# Patient Record
Sex: Male | Born: 1965 | Race: Black or African American | Hispanic: No | Marital: Married | Smoking: Light tobacco smoker
Health system: Southern US, Community
[De-identification: ages and names within clinical notes are randomized; demographics above are authoritative.]

## PROBLEM LIST (undated history)

## (undated) DIAGNOSIS — I10 Essential (primary) hypertension: Secondary | ICD-10-CM

## (undated) DIAGNOSIS — Z87442 Personal history of urinary calculi: Secondary | ICD-10-CM

## (undated) DIAGNOSIS — N200 Calculus of kidney: Secondary | ICD-10-CM

## (undated) HISTORY — PX: FOOT SURGERY: SHX648

---

## 2000-03-20 ENCOUNTER — Emergency Department (HOSPITAL_COMMUNITY): Admission: EM | Admit: 2000-03-20 | Discharge: 2000-03-20 | Payer: Self-pay | Admitting: Emergency Medicine

## 2000-03-20 ENCOUNTER — Encounter: Payer: Self-pay | Admitting: Emergency Medicine

## 2004-07-28 ENCOUNTER — Encounter: Admission: RE | Admit: 2004-07-28 | Discharge: 2004-07-28 | Payer: Self-pay | Admitting: Otolaryngology

## 2005-05-04 ENCOUNTER — Emergency Department (HOSPITAL_COMMUNITY): Admission: EM | Admit: 2005-05-04 | Discharge: 2005-05-04 | Payer: Self-pay | Admitting: Family Medicine

## 2006-06-28 ENCOUNTER — Ambulatory Visit: Payer: Self-pay | Admitting: Gastroenterology

## 2006-07-02 ENCOUNTER — Ambulatory Visit: Payer: Self-pay | Admitting: Gastroenterology

## 2006-07-18 ENCOUNTER — Ambulatory Visit: Payer: Self-pay | Admitting: Gastroenterology

## 2006-07-20 ENCOUNTER — Ambulatory Visit: Payer: Self-pay | Admitting: Cardiology

## 2011-02-19 ENCOUNTER — Emergency Department (HOSPITAL_COMMUNITY)
Admission: EM | Admit: 2011-02-19 | Discharge: 2011-02-19 | Disposition: A | Discharge status: Home / Self Care | Payer: 59 | Attending: Emergency Medicine | Admitting: Emergency Medicine

## 2011-02-19 ENCOUNTER — Emergency Department (HOSPITAL_COMMUNITY): Payer: 59

## 2011-02-19 DIAGNOSIS — R109 Unspecified abdominal pain: Secondary | ICD-10-CM | POA: Insufficient documentation

## 2011-02-19 DIAGNOSIS — R112 Nausea with vomiting, unspecified: Secondary | ICD-10-CM | POA: Insufficient documentation

## 2011-02-19 DIAGNOSIS — N1330 Unspecified hydronephrosis: Secondary | ICD-10-CM | POA: Insufficient documentation

## 2011-02-19 DIAGNOSIS — N201 Calculus of ureter: Secondary | ICD-10-CM | POA: Insufficient documentation

## 2011-02-19 LAB — COMPREHENSIVE METABOLIC PANEL
BUN: 12 mg/dL (ref 6–23)
CO2: 24 mEq/L (ref 19–32)
Chloride: 105 mEq/L (ref 96–112)
Creatinine, Ser: 1.51 mg/dL — ABNORMAL HIGH (ref 0.4–1.5)
GFR calc non Af Amer: 51 mL/min — ABNORMAL LOW (ref >60)
Total Bilirubin: 0.6 mg/dL (ref 0.3–1.2)

## 2011-02-19 LAB — DIFFERENTIAL
Basophils Absolute: 0 10*3/uL (ref 0.0–0.1)
Basophils Relative: 0 % (ref 0–1)
Eosinophils Absolute: 0.3 10*3/uL (ref 0.0–0.7)
Eosinophils Relative: 3 % (ref 0–5)
Lymphocytes Relative: 53 % — ABNORMAL HIGH (ref 12–46)
Lymphs Abs: 4.6 10*3/uL — ABNORMAL HIGH (ref 0.7–4.0)
Monocytes Absolute: 0.7 10*3/uL (ref 0.1–1.0)
Monocytes Relative: 8 % (ref 3–12)
Neutro Abs: 3.2 10*3/uL (ref 1.7–7.7)
Neutrophils Relative %: 36 % — ABNORMAL LOW (ref 43–77)

## 2011-02-19 LAB — CBC
HCT: 43.3 % (ref 39.0–52.0)
Hemoglobin: 14.8 g/dL (ref 13.0–17.0)
MCH: 26.9 pg (ref 26.0–34.0)
MCV: 78.7 fL (ref 78.0–100.0)
RBC: 5.5 MIL/uL (ref 4.22–5.81)

## 2011-02-19 LAB — URINALYSIS, ROUTINE W REFLEX MICROSCOPIC
Bilirubin Urine: NEGATIVE
Nitrite: NEGATIVE
Specific Gravity, Urine: 1.029 (ref 1.005–1.030)
Urobilinogen, UA: 1 mg/dL (ref 0.0–1.0)

## 2011-09-14 DIAGNOSIS — F419 Anxiety disorder, unspecified: Secondary | ICD-10-CM | POA: Insufficient documentation

## 2011-10-26 DIAGNOSIS — Z0000 Encounter for general adult medical examination without abnormal findings: Secondary | ICD-10-CM | POA: Insufficient documentation

## 2015-06-01 ENCOUNTER — Encounter (HOSPITAL_COMMUNITY): Payer: Self-pay | Admitting: Emergency Medicine

## 2015-06-01 ENCOUNTER — Emergency Department (HOSPITAL_COMMUNITY): Payer: Commercial Managed Care - HMO

## 2015-06-01 ENCOUNTER — Emergency Department (HOSPITAL_COMMUNITY)
Admission: EM | Admit: 2015-06-01 | Discharge: 2015-06-01 | Disposition: A | Discharge status: Home / Self Care | Payer: Commercial Managed Care - HMO | Attending: Emergency Medicine | Admitting: Emergency Medicine

## 2015-06-01 DIAGNOSIS — I10 Essential (primary) hypertension: Secondary | ICD-10-CM | POA: Diagnosis not present

## 2015-06-01 DIAGNOSIS — N200 Calculus of kidney: Secondary | ICD-10-CM | POA: Diagnosis not present

## 2015-06-01 DIAGNOSIS — R109 Unspecified abdominal pain: Secondary | ICD-10-CM | POA: Diagnosis present

## 2015-06-01 HISTORY — DX: Calculus of kidney: N20.0

## 2015-06-01 LAB — BASIC METABOLIC PANEL
ANION GAP: 11 (ref 5–15)
BUN: 9 mg/dL (ref 6–20)
CO2: 26 mmol/L (ref 22–32)
Calcium: 8.9 mg/dL (ref 8.9–10.3)
Chloride: 102 mmol/L (ref 101–111)
Creatinine, Ser: 1.56 mg/dL — ABNORMAL HIGH (ref 0.61–1.24)
GFR calc Af Amer: 59 mL/min — ABNORMAL LOW (ref >60)
GFR, EST NON AFRICAN AMERICAN: 51 mL/min — AB (ref >60)
Glucose, Bld: 130 mg/dL — ABNORMAL HIGH (ref 65–99)
Potassium: 3.3 mmol/L — ABNORMAL LOW (ref 3.5–5.1)
Sodium: 139 mmol/L (ref 135–145)

## 2015-06-01 LAB — URINALYSIS, ROUTINE W REFLEX MICROSCOPIC
Bilirubin Urine: NEGATIVE
Glucose, UA: NEGATIVE mg/dL
Ketones, ur: NEGATIVE mg/dL
LEUKOCYTES UA: NEGATIVE
NITRITE: NEGATIVE
PROTEIN: NEGATIVE mg/dL
Specific Gravity, Urine: 1.013 (ref 1.005–1.030)
Urobilinogen, UA: 0.2 mg/dL (ref 0.0–1.0)
pH: 5.5 (ref 5.0–8.0)

## 2015-06-01 LAB — CBC
HCT: 41.9 % (ref 39.0–52.0)
Hemoglobin: 14.3 g/dL (ref 13.0–17.0)
MCH: 26.9 pg (ref 26.0–34.0)
MCHC: 34.1 g/dL (ref 30.0–36.0)
MCV: 78.9 fL (ref 78.0–100.0)
Platelets: 197 10*3/uL (ref 150–400)
RBC: 5.31 MIL/uL (ref 4.22–5.81)
RDW: 13.2 % (ref 11.5–15.5)
WBC: 7.8 10*3/uL (ref 4.0–10.5)

## 2015-06-01 LAB — URINE MICROSCOPIC-ADD ON

## 2015-06-01 MED ORDER — MORPHINE SULFATE 4 MG/ML IJ SOLN
4.0000 mg | Freq: Once | INTRAMUSCULAR | Status: AC
  Administered 2015-06-01: 4 mg via INTRAVENOUS
  Filled 2015-06-01: qty 1

## 2015-06-01 MED ORDER — KETOROLAC TROMETHAMINE 30 MG/ML IJ SOLN
30.0000 mg | Freq: Once | INTRAMUSCULAR | Status: AC
  Administered 2015-06-01: 30 mg via INTRAVENOUS
  Filled 2015-06-01: qty 1

## 2015-06-01 MED ORDER — HYDROCODONE-ACETAMINOPHEN 5-325 MG PO TABS: 1.0000 | ORAL_TABLET | ORAL | Status: DC | PRN

## 2015-06-01 NOTE — Discharge Instructions (Signed)
Kidney Stones °Kidney stones (urolithiasis) are deposits that form inside your kidneys. The intense pain is caused by the stone moving through the urinary tract. When the stone moves, the ureter goes into spasm around the stone. The stone is usually passed in the urine.  °CAUSES  °· A disorder that makes certain neck glands produce too much parathyroid hormone (primary hyperparathyroidism). °· A buildup of uric acid crystals, similar to gout in your joints. °· Narrowing (stricture) of the ureter. °· A kidney obstruction present at birth (congenital obstruction). °· Previous surgery on the kidney or ureters. °· Numerous kidney infections. °SYMPTOMS  °· Feeling sick to your stomach (nauseous). °· Throwing up (vomiting). °· Blood in the urine (hematuria). °· Pain that usually spreads (radiates) to the groin. °· Frequency or urgency of urination. °DIAGNOSIS  °· Taking a history and physical exam. °· Blood or urine tests. °· CT scan. °· Occasionally, an examination of the inside of the urinary bladder (cystoscopy) is performed. °TREATMENT  °· Observation. °· Increasing your fluid intake. °· Extracorporeal shock wave lithotripsy--This is a noninvasive procedure that uses shock waves to break up kidney stones. °· Surgery may be needed if you have severe pain or persistent obstruction. There are various surgical procedures. Most of the procedures are performed with the use of small instruments. Only small incisions are needed to accommodate these instruments, so recovery time is minimized. °The size, location, and chemical composition are all important variables that will determine the proper choice of action for you. Talk to your health care provider to better understand your situation so that you will minimize the risk of injury to yourself and your kidney.  °HOME CARE INSTRUCTIONS  °· Drink enough water and fluids to keep your urine clear or pale yellow. This will help you to pass the stone or stone fragments. °· Strain  all urine through the provided strainer. Keep all particulate matter and stones for your health care provider to see. The stone causing the pain may be as small as a grain of salt. It is very important to use the strainer each and every time you pass your urine. The collection of your stone will allow your health care provider to analyze it and verify that a stone has actually passed. The stone analysis will often identify what you can do to reduce the incidence of recurrences. °· Only take over-the-counter or prescription medicines for pain, discomfort, or fever as directed by your health care provider. °· Make a follow-up appointment with your health care provider as directed. °· Get follow-up X-rays if required. The absence of pain does not always mean that the stone has passed. It may have only stopped moving. If the urine remains completely obstructed, it can cause loss of kidney function or even complete destruction of the kidney. It is your responsibility to make sure X-rays and follow-ups are completed. Ultrasounds of the kidney can show blockages and the status of the kidney. Ultrasounds are not associated with any radiation and can be performed easily in a matter of minutes. °SEEK MEDICAL CARE IF: °· You experience pain that is progressive and unresponsive to any pain medicine you have been prescribed. °SEEK IMMEDIATE MEDICAL CARE IF:  °· Pain cannot be controlled with the prescribed medicine. °· You have a fever or shaking chills. °· The severity or intensity of pain increases over 18 hours and is not relieved by pain medicine. °· You develop a new onset of abdominal pain. °· You feel faint or pass out. °·   You are unable to urinate. MAKE SURE YOU:   Understand these instructions.  Will watch your condition.  Will get help right away if you are not doing well or get worse. Document Released: 10/23/2005 Document Revised: 06/25/2013 Document Reviewed: 03/26/2013 [facility] Patient Information 2015  [place], [place]. This information is not intended to replace advice given to you by your health care provider. Make sure you discuss any questions you have with your health care provider. Hypertension Hypertension, commonly called high blood pressure, is when the force of blood pumping through your arteries is too strong. Your arteries are the blood vessels that carry blood from your heart throughout your body. A blood pressure reading consists of a higher number over a lower number, such as 110/72. The higher number (systolic) is the pressure inside your arteries when your heart pumps. The lower number (diastolic) is the pressure inside your arteries when your heart relaxes. Ideally you want your blood pressure below 120/80. Hypertension forces your heart to work harder to pump blood. Your arteries may become narrow or stiff. Having hypertension puts you at risk for heart disease, stroke, and other problems.  RISK FACTORS Some risk factors for high blood pressure are controllable. Others are not.  Risk factors you cannot control include:   Race. You may be at higher risk if you are African American.  Age. Risk increases with age.  Gender. Men are at higher risk than women before age 33 years. After age 11, women are at higher risk than men. Risk factors you can control include:  Not getting enough exercise or physical activity.  Being overweight.  Getting too much fat, sugar, calories, or salt in your diet.  Drinking too much alcohol. SIGNS AND SYMPTOMS Hypertension does not usually cause signs or symptoms. Extremely high blood pressure (hypertensive crisis) may cause headache, anxiety, shortness of breath, and nosebleed. DIAGNOSIS  To check if you have hypertension, your health care provider will measure your blood pressure while you are seated, with your arm held at the level of your heart. It should be measured at least twice using the same arm. Certain conditions can cause a difference in  blood pressure between your right and left arms. A blood pressure reading that is higher than normal on one occasion does not mean that you need treatment. If one blood pressure reading is high, ask your health care provider about having it checked again. TREATMENT  Treating high blood pressure includes making lifestyle changes and possibly taking medicine. Living a healthy lifestyle can help lower high blood pressure. You may need to change some of your habits. Lifestyle changes may include:  Following the DASH diet. This diet is high in fruits, vegetables, and whole grains. It is low in salt, red meat, and added sugars.  Getting at least 2 hours of brisk physical activity every week.  Losing weight if necessary.  Not smoking.  Limiting alcoholic beverages.  Learning ways to reduce stress. If lifestyle changes are not enough to get your blood pressure under control, your health care provider may prescribe medicine. You may need to take more than one. Work closely with your health care provider to understand the risks and benefits. HOME CARE INSTRUCTIONS  Have your blood pressure rechecked as directed by your health care provider.   Take medicines only as directed by your health care provider. Follow the directions carefully. Blood pressure medicines must be taken as prescribed. The medicine does not work as well when you skip doses. Skipping doses  also puts you at risk for problems.   Do not smoke.   Monitor your blood pressure at home as directed by your health care provider. SEEK MEDICAL CARE IF:   You think you are having a reaction to medicines taken.  You have recurrent headaches or feel dizzy.  You have swelling in your ankles.  You have trouble with your vision. SEEK IMMEDIATE MEDICAL CARE IF:  You develop a severe headache or confusion.  You have unusual weakness, numbness, or feel faint.  You have severe chest or abdominal pain.  You vomit repeatedly.  You  have trouble breathing. MAKE SURE YOU:   Understand these instructions.  Will watch your condition.  Will get help right away if you are not doing well or get worse. Document Released: 10/23/2005 Document Revised: 03/09/2014 Document Reviewed: 08/15/2013 [facility] Patient Information 2015 [place], [place]. This information is not intended to replace advice given to you by your health care provider. Make sure you discuss any questions you have with your health care provider.  Emergency Department Resource Guide 1) Find a Doctor and Pay Out of Pocket Although you won't have to find out who is covered by your insurance plan, it is a good idea to ask around and get recommendations. You will then need to call the office and see if the doctor you have chosen will accept you as a new patient and what types of options they offer for patients who are self-pay. Some doctors offer discounts or will set up payment plans for their patients who do not have insurance, but you will need to ask so you aren't surprised when you get to your appointment.  2) Contact Your Local Health Department Not all health departments have doctors that can see patients for sick visits, but many do, so it is worth a call to see if yours does. If you don't know where your local health department is, you can check in your phone book. The CDC also has a tool to help you locate your state's health department, and many state websites also have listings of all of their local health departments.  3) Find a [facility] If your illness is not likely to be very severe or complicated, you may want to try a walk in clinic. These are popping up all over the country in pharmacies, drugstores, and shopping centers. They're usually staffed by nurse practitioners or physician assistants that have been trained to treat common illnesses and complaints. They're usually fairly quick and inexpensive. However, if you have serious medical issues or  chronic medical problems, these are probably not your best option.  No Primary Care Doctor: - Call Health Connect at  [phone] - they can help you locate a primary care doctor that  accepts your insurance, provides certain services, etc. - Physician Referral Service- [phone]  Chronic Pain Problems: Organization         Address  Phone   Notes  [facility]  [phone] Patients need to be referred by their primary care doctor.   Medication Assistance: Organization         Address  Phone   Notes  [facility] Medication [facility] [address] [place], [place] [address] [phone] --Must be a resident of [facility] -- Must have NO insurance coverage whatsoever (no Medicaid/ Medicare, etc.) -- The pt. MUST have a primary care doctor that directs their care regularly and follows them in the community   MedAssist  (  [phone]   [employer]  [phone]    Agencies that provide inexpensive medical care: Organization         Address  Phone   Notes  [place]  [phone]   [facility]    [phone]   [facility] [place], [place] [address] [phone]   [place] [address], [place] [phone]   Planned Parenthood    [phone]   [facility]    [phone]   [place] and [place] [address], [place] Phone:  [phone], Fax:  [phone] Hours of Operation:  9 am - 6 pm, M-F.  Also accepts Medicaid/Medicare and self-pay.  [facility] [place], [address], [place] Phone: [phone], Fax: [phone]. Hours of Operation:  8:30 am - 5:30 pm, M-F.  Also accepts Medicaid and self-pay.  [facility] [place] [address] [place] Phone: [phone]   [place], [place], [place]  [phone], Ext. [phone] Mondays & Thursdays: 7-9 AM.  First 15 patients are seen on a first come, first serve basis.    [place] Providers:  Organization         Address  Phone   Notes  [facility] [address],  [phone] Also accepts self-pay patients.  [facility] [address], [place]  [phone]   [place], [address], [place] [phone]   [facility] [address] [place] [phone]   Lucianne Lei [address], [place]   [phone] Only accepts [place] Access [place] patients after they have their name applied to their card.   Self-Pay (no insurance) in [facility]:  Organization         Address  Phone   Notes  Sickle Cell Patients, [facility] [place] [phone]   [facility] [place] [phone]   [facility] [place]  [place], [place], [place] [phone]   [facility]/Dr. Osei-Bonsu  [address], [place] or [address], [place] [phone] Phone number for both [place] and [place] locations is the same.  [facility] [address] [place] [phone]   [place] [address] [place] or [address] [phone]   [facility] [place] [address] [place] [phone], phone; [phone], fax Sees patients 1st and 3rd Saturday of every month.  Must not qualify for public or private insurance (i.e. Medicaid, Medicare, [place] Health Choice, Veterans' Benefits)  Household income should be no more than 200% of the poverty level The clinic cannot treat you if you are pregnant or think you are pregnant  Sexually transmitted  diseases are not treated at the clinic.    Dental Care: Organization         Address  Phone  Notes  [facility] of [facility] [phone] Accepts children up to age 14 who are enrolled in [place] or [place]; pregnant women  with a Medicaid card; and children who have applied for Medicaid or [place] Health Choice, but were declined, whose parents can pay a reduced fee at time of service.  [facility] Department of [facility]  [address], [place] [phone] Accepts children up to age 25 who are enrolled in [place] or [place]; pregnant women with a Medicaid card; and children who have applied for Medicaid or [place] Health Choice, but were declined, whose parents can pay a reduced fee at time of service.  [place] [facility] Adult Dental Access PROGRAM  [place] [phone] Patients are seen by appointment only. Walk-ins are not accepted. [place] will see patients 67 years of age and older. Monday - Tuesday (8am-5pm) Most Wednesdays (8:30-5pm) $30 per visit, cash only  [facility] Adult Dental Access PROGRAM  [address], [facility] [phone] Patients are seen by appointment only. Walk-ins are not accepted. [place] will see patients 76 years of age and older. One Wednesday Evening (Monthly: Volunteer Based).  $30 per visit, cash only  [place]  [phone] for adults; Children under age 60, call Graduate Pediatric Dentistry at [phone]. Children aged 78-14, please call [phone] to request a pediatric application.  Dental services are provided in all areas of dental care including fillings, crowns and bridges, complete and partial dentures, implants, gum treatment, root canals, and extractions. Preventive care is also provided. Treatment is provided to both adults and children. Patients are selected via a  lottery and there is often a waiting list.   [facility] [address] [place]  [phone] [email or web address]   [facility] [address] [place], [place] [phone], Ext. [phone] Second and Fourth Thursday of each month, opens at 6:30 AM; Clinic ends at 9 AM.  Patients are seen on a first-come first-served basis, and a limited number are seen during each clinic.   [facility]  [address] [place] Danker [place], [place] [phone]   Eligibility Requirements You must have lived in [place], [place], or [place] counties for at least the last three months.   You cannot be eligible for state or federal sponsored [employer], including [employer], [place], or [employer].   You generally cannot be eligible for healthcare insurance through your employer.    How to apply: Eligibility screenings are held every Tuesday and Wednesday afternoon from 1:00 pm until 4:00 pm. You do not need an appointment for the interview!  [facility] [address], [place], [place]   [place]  [place] Department  [place]  [phone]    Behavioral Health Resources in the Community: Intensive Outpatient Programs Organization         Address  Phone  Notes  [place]erman Clarence. [address], [place], [place] [phone]   [facility] Outpatient [address] [place], [place]   ADS: Alcohol & Drug S[facility] [address] [place], [place]   [place] [address]  [place], [place] or [phone]   Substance Abuse Resources Organization         Address  Phone  Notes  Alcohol and Drug Services  [phone]   [facility]  [phone]   [facility]   [facility]  [phone]   Residential &  Outpatient Substance Abuse Program  [phone]   Psychological Services Organization         Address  Phone  Notes  [facility]  [place]  [phone]   [place] [address], [place] or [phone]    Mobile Crisis Teams Organization         Address  Phone  Notes  [facility], Mobile Crisis Care Unit  [phone]   [facility]  [address] [place], [place]   [place] [address], [place] [phone]    Self-Help/Support Groups Organization         Address  Phone             Notes  [place]. of [place] - variety of support groups  [place] Call for more information  Narcotics Anonymous (NA), [facility]aring [facility] [address], [employer] [place]  2 meetings at this location   Special educational needs teacher         Address  Phone  Notes  [facility] [place],    [place]  [phone]   [facility]  [address], [place] [address]17711, [place], [place]   [place], [place] [phone] Admissions: 8am-3pm M-F  Incentives Substance [place] [address]    [place], [place] [phone]   [facility] [address] [place], [place], [place]   [facility] [address],  [place], [place]   [facility]ight Programs - Intensive Outpatient [address] Kristeen [address], [place], [place]   [facility] ([place].) [address].,  [place], [place] [phone] or [phone]   [facility]idential Treatment Services (RTS) [address] [place], [place] Accepts Medicaid  [facility] [place] [address]  [place] [phone] Substance Abuse/Addiction Treatment   [facility] Organization          Address  Phone  Notes  [facility]  [phone]   Domenic Schwab, PhD [address] Arlis [place], [place]   [phone] or [phone]   [place] Montura Foley [place], [place] [phone]   [facility] [address] [place], [place] [phone] Insurance/Medicaid/sponsorship through [facility] and Families [address], [address] [place]                                    [place], [place] [phone] [place] [address], [place] [phone]    Dr. Adele Schilder  [phone]   [facility] [place] Dept. 1) [address] [place] 2) [place] 3)  [place] [place] [address], [place] [phone]  [phone]   [place] [phone] or [phone] (After Hours)

## 2015-06-01 NOTE — ED Provider Notes (Signed)
CSN: [id number]     Arrival date & time 06/01/15  0425 History   First MD Initiated Contact with Patient 06/01/15 [phone]     Chief Complaint  Patient presents with  . Flank Pain     (Consider location/radiation/quality/duration/timing/severity/associated sxs/prior Treatment) HPI Sudden onset of right flank pain last night. Awoke from sleep at 3 in the morning. Feels like prior kidney stone. Some associated nausea no vomiting. Radius to groin with no associated testicular pain. Urinary urgency after onset of symptoms. No fever no recent illness. Patient has history kidney stones this is very similar to prior. Past Medical History  Diagnosis Date  . Kidney stone    History reviewed. No pertinent past surgical history. No family history on file. History  Substance Use Topics  . Smoking status: Never Smoker   . Smokeless tobacco: Not on file  . Alcohol Use: No    Review of Systems  10 Systems reviewed and are negative for acute change except as noted in the HPI.   Allergies  Review of patient's allergies indicates no known allergies.  Home Medications   Prior to Admission medications   Medication Sig Start Date End Date Taking? Authorizing Provider  HYDROcodone-acetaminophen (NORCO/VICODIN) 5-325 MG per tablet Take 1-2 tablets by mouth every 4 (four) hours as needed for moderate pain or severe pain. 06/01/15   Charlesetta Shanks, MD   BP 160/88 mmHg  Pulse 58  Temp(Src) 97.5 F (36.4 C) (Oral)  Resp 18  Ht 5\' 10"  (1.778 m)  Wt 260 lb (117.935 kg)  BMI 37.31 kg/m2  SpO2 98% Physical Exam  Constitutional: He is oriented to person, place, and time. He appears well-developed and well-nourished.  HENT:  Head: Normocephalic and atraumatic.  Eyes: EOM are normal. Pupils are equal, round, and reactive to light.  Neck: Neck supple.  Cardiovascular: Normal rate, regular rhythm, normal heart sounds and intact distal pulses.   Pulmonary/Chest: Effort normal and breath sounds normal.   Abdominal: Soft. Bowel sounds are normal. He exhibits no distension. There is no tenderness.  No flank pain to percussion.  Musculoskeletal: Normal range of motion. He exhibits no edema.  Neurological: He is alert and oriented to person, place, and time. He has normal strength. Coordination normal. GCS eye subscore is 4. GCS verbal subscore is 5. GCS motor subscore is 6.  Skin: Skin is warm, dry and intact.  Psychiatric: He has a normal mood and affect.    ED Course  Procedures (including critical care time) Labs Review Labs Reviewed  URINALYSIS, ROUTINE W REFLEX MICROSCOPIC (NOT AT [facility]) - Abnormal; Notable for the following:    Hgb urine dipstick MODERATE (*)    All other components within normal limits  BASIC METABOLIC PANEL - Abnormal; Notable for the following:    Potassium 3.3 (*)    Glucose, Bld 130 (*)    Creatinine, Ser 1.56 (*)    GFR calc non Af Amer 51 (*)    GFR calc Af Amer 59 (*)    All other components within normal limits  CBC  URINE MICROSCOPIC-ADD ON    Imaging Review Ct Renal Stone Study  06/01/2015   CLINICAL DATA:  49 year old male with right flank and lateral abdominal pain. Microscopic hematuria. History kidney stones. No prior surgery. Subsequent encounter.  EXAM: CT ABDOMEN AND PELVIS WITHOUT CONTRAST  TECHNIQUE: Multidetector CT imaging of the abdomen and pelvis was performed following the standard protocol without IV contrast.  COMPARISON:  10/11/2012 CT.  FINDINGS: 2 mm  mid to upper pole right renal nonobstructing calculus. Mild haziness surrounds the proximal right ureter without calcified obstructing stone identified or evidence of hydronephrosis. This may indicate changes of recently passed stone.  2 cm right lower pole renal cyst.  Scattered diverticula most notable descending colon and sigmoid colon without extra luminal bowel inflammatory process, free fluid or free air. Specifically, no inflammation surrounds the appendix or terminal ileum. Stool like  appearance of the terminal ileum may indicate changes of malabsorption.  Supraumbilical fat and vessel containing abdominal wall hernia similar to prior exam.  Small hiatal hernia. Lung bases clear. Heart size within normal limits.  Taking into account limitation by non contrast imaging, no worrisome hepatic, splenic, pancreatic, left renal or adrenal abnormality. No calcified gallstones.  No abdominal aortic aneurysm.  No adenopathy. Sub cm slightly rounded lymph node posterior to pancreas.  Minimally lobulated prostate gland. Noncontrast filled underdistended views the urinary bladder without obvious abnormality.  No bony destructive lesion. Probable bone island left intertrochanteric region without change. Small bone island left ilium stable. Bony overgrowth sacroiliac joint.  IMPRESSION: 2 mm mid to upper pole right renal nonobstructing calculus. Mild haziness surrounds the proximal right ureter without calcified obstructing stone identified or evidence of hydronephrosis. This may indicate changes of recently passed stone.  2 cm right lower pole renal cyst.  Scattered diverticula most notable descending colon and sigmoid colon without extra luminal bowel inflammatory process, free fluid or free air. Specifically, no inflammation surrounds the appendix or terminal ileum. Stool like appearance of the terminal ileum may indicate changes of malabsorption.  Supraumbilical fat and vessel containing abdominal wall hernia similar to prior exam.  Small hiatal hernia.  Minimally lobulated prostate gland.  Under distended views of the urinary bladder without gross abnormality.   Electronically Signed   By: Genia Del M.D.   On: 06/01/2015 08:14     EKG Interpretation None      MDM   Final diagnoses:  Kidney stone  Chronic hypertension    Patient has history kidney stones. He has a small kidney stone today. Symptoms are well controlled with pain medication. Patient has had borderline hypertension which has  been untreated. He does not have any and organ damage symptoms today. He has had a borderline increased creatinine in association with this. Patient was counseled on hypertension and the necessity of treatment with risk of kidney failure, coronary artery disease and other complications. He is in good condition today alert appropriate and pain with a kidney stone is not intractable.    Charlesetta Shanks, MD 06/01/15 [phone]

## 2015-06-01 NOTE — ED Notes (Signed)
Patient here with complaint of right flank pain. States history of the same about 2 years ago. Pain much worse that time. Started this AM "at 0336 exactly".

## 2018-07-20 ENCOUNTER — Ambulatory Visit: Payer: 59 | Admitting: Podiatry

## 2018-07-24 ENCOUNTER — Other Ambulatory Visit: Payer: Self-pay

## 2018-07-27 ENCOUNTER — Ambulatory Visit (INDEPENDENT_AMBULATORY_CARE_PROVIDER_SITE_OTHER): Payer: 59

## 2018-07-27 ENCOUNTER — Encounter: Payer: Self-pay | Admitting: Sports Medicine

## 2018-07-27 ENCOUNTER — Other Ambulatory Visit: Payer: Self-pay

## 2018-07-27 ENCOUNTER — Ambulatory Visit (INDEPENDENT_AMBULATORY_CARE_PROVIDER_SITE_OTHER): Payer: 59 | Admitting: Sports Medicine

## 2018-07-27 ENCOUNTER — Other Ambulatory Visit: Payer: Self-pay | Admitting: Sports Medicine

## 2018-07-27 DIAGNOSIS — M79672 Pain in left foot: Secondary | ICD-10-CM | POA: Diagnosis not present

## 2018-07-27 DIAGNOSIS — M67472 Ganglion, left ankle and foot: Secondary | ICD-10-CM

## 2018-07-27 NOTE — Progress Notes (Signed)
Subjective: Jeremy Gray is a 52 y.o. male patient who presents to office for evaluation of left foot pain. Patient complains of progressive pain especially over the last year that has slowly gotten worse over the small growth or not at his arch area on the left foot states that this area is very tender worse when he is putting pressure. Patient has not tried any treatment for this. Patient denies any other pedal complaints. Denies injury/trip/fall/sprain/any causative factors.   Review of Systems  Musculoskeletal: Positive for myalgias.  All other systems reviewed and are negative.    Patient Active Problem List   Diagnosis Date Noted  . Encounter for general adult medical examination without abnormal findings 10/26/2011  . Anxiety 09/14/2011    No current outpatient medications on file prior to visit.   No current facility-administered medications on file prior to visit.     No Known Allergies  Objective:  General: Alert and oriented x3 in no acute distress  Dermatology: Soft tissue mass at medial arch of left foot with mild callus overlying it with no signs of infection, No open lesions bilateral lower extremities, no webspace macerations, no ecchymosis bilateral, all nails x 10 are well manicured.  Vascular: Dorsalis Pedis and Posterior Tibial pedal pulses palpable, Capillary Fill Time 3 seconds,(+) pedal hair growth bilateral, no edema bilateral lower extremities, Temperature gradient within normal limits.  Neurology: Johney Maine sensation intact via light touch bilateral, (- )Tinels sign bilateral.   Musculoskeletal: Mild tenderness with palpation at at left medial arch on left, No pain with calf compression bilateral. There is decreased ankle rom with knee extending  vs flexed resembling gastroc equnius bilateral,+ pes planus foot type bilateral, Strength within normal limits in all groups bilateral.   Gait: Antalgic gait  Xrays  Left Foot   Impression:Normal osseous  mineralization, midtarsal breech supportive of pes planus, inferior heel spur, no other acute findings.  Assessment and Plan: Problem List Items Addressed This Visit    None    Visit Diagnoses    Left foot pain    -  Primary   Relevant Orders   DG Foot Complete Right   Ganglion cyst of left foot       Arch pain of left foot          -Complete examination performed -Xrays reviewed -Discussed treatement options for pain and possible cyst vs soft tissue mass at medial arch on Left -Rx Ultrasound to evaluate mass at left arch -Recommend gentle stretching, ice, motrin/tylenol as needed and offloading padding as provided -Patient to return to office after Ultrasound or sooner if condition worsens.  Landis Martins, DPM

## 2018-07-29 ENCOUNTER — Telehealth: Payer: Self-pay | Admitting: *Deleted

## 2018-07-29 DIAGNOSIS — M67472 Ganglion, left ankle and foot: Secondary | ICD-10-CM

## 2018-07-29 DIAGNOSIS — M79672 Pain in left foot: Secondary | ICD-10-CM

## 2018-07-29 NOTE — Telephone Encounter (Signed)
Faxed orders to [facility] Main Scheduling. 

## 2018-07-29 NOTE — Telephone Encounter (Signed)
-----   Message from Landis Martins, [place] sent at 07/27/2018  8:50 AM EDT ----- Regarding: US Soft tissue mass at medial arch that is painful left foot

## 2018-08-05 ENCOUNTER — Ambulatory Visit (HOSPITAL_COMMUNITY)
Admission: RE | Admit: 2018-08-05 | Discharge: 2018-08-05 | Disposition: A | Discharge status: Home / Self Care | Payer: 59 | Source: Ambulatory Visit | Attending: Sports Medicine | Admitting: Sports Medicine

## 2018-08-05 DIAGNOSIS — M67472 Ganglion, left ankle and foot: Secondary | ICD-10-CM | POA: Insufficient documentation

## 2018-08-05 DIAGNOSIS — M79672 Pain in left foot: Secondary | ICD-10-CM | POA: Diagnosis present

## 2018-08-07 ENCOUNTER — Telehealth: Payer: Self-pay | Admitting: *Deleted

## 2018-08-07 NOTE — Telephone Encounter (Signed)
-----   Message from Landis Martins, [place] sent at 08/06/2018 12:52 PM EDT ----- [place] results are in. It is a begnin lesion/mass at the medial aspect of his Left foot. Patient should make an appointment within 2 weeks for [place] to go over results in person and discuss plan -Dr. Cannon Kettle

## 2018-08-07 NOTE — Telephone Encounter (Signed)
Left message on mobile for Mr. Bokhari to call me.

## 2018-08-07 NOTE — Telephone Encounter (Signed)
I called pt and informed of Dr. Leeanne Rio review of results and orders. Transferred pt to scheduler.

## 2018-08-20 ENCOUNTER — Ambulatory Visit: Payer: 59 | Admitting: Sports Medicine

## 2018-09-03 ENCOUNTER — Encounter

## 2018-09-03 ENCOUNTER — Other Ambulatory Visit: Payer: Self-pay

## 2018-09-03 ENCOUNTER — Ambulatory Visit: Payer: 59 | Admitting: Sports Medicine

## 2018-09-03 ENCOUNTER — Encounter: Payer: Self-pay | Admitting: Sports Medicine

## 2018-09-03 DIAGNOSIS — M79672 Pain in left foot: Secondary | ICD-10-CM

## 2018-09-03 DIAGNOSIS — M60272 Foreign body granuloma of soft tissue, not elsewhere classified, left ankle and foot: Secondary | ICD-10-CM

## 2018-09-03 NOTE — Progress Notes (Signed)
Subjective: Jeremy Gray is a 52 y.o. male patient who returns to office for follow-up evaluation of left foot and arch pain.  Patient states that he got his ultrasound done and reports that he still has pain and the knot that is present in his left arch area states that rest protection icing and taking anti-inflammatories occasionally seems to help however still concerned that the mass is still there and wants to discuss his ultrasound results and to discuss plan of care.  Patient denies changes with medicines or health since last visit and denies nausea vomiting fever chills or any other constitutional symptoms at this time.   Patient Active Problem List   Diagnosis Date Noted  . Encounter for general adult medical examination without abnormal findings 10/26/2011  . Anxiety 09/14/2011    Current Outpatient Medications on File Prior to Visit  Medication Sig Dispense Refill  . amLODipine (NORVASC) 5 MG tablet Take 5 mg by mouth daily.  1  . losartan (COZAAR) 50 MG tablet Take 50 mg by mouth daily.  1   No current facility-administered medications on file prior to visit.     No Known Allergies  Objective:  General: Alert and oriented x3 in no acute distress  Dermatology: Soft tissue mass at medial arch of left foot with mild callus overlying it with no signs of infection and no acute changes since last exam, No open lesions bilateral lower extremities, no webspace macerations, no ecchymosis bilateral, all nails x 10 are well manicured.  Vascular: Dorsalis Pedis and Posterior Tibial pedal pulses palpable, Capillary Fill Time 3 seconds,(+) pedal hair growth bilateral, no edema bilateral lower extremities, Temperature gradient within normal limits.  Neurology: Gross sensation intact via light touch bilateral, (- )Tinels sign bilateral.   Musculoskeletal: Mild tenderness with palpation at at left medial arch on left at soft tissue mass, No pain with calf compression bilateral. There is  decreased ankle rom with knee extending  vs flexed resembling gastroc equnius bilateral,+ pes planus foot type bilateral, Strength within normal limits in all groups bilateral.   Assessment and Plan: Problem List Items Addressed This Visit    None    Visit Diagnoses    Foreign body granuloma of soft tissue of left foot    -  Primary   Arch pain of left foot       Left foot pain          -Complete examination performed -Ultrasound results reviewed suggestive of nonspecific Solik lesion in the area of concern possible foreign body granuloma but difficult to characterize -Discussed treatement options for pain and soft tissue mass at medial arch on Left -Patient opt for surgical management. Consent obtained for excision of soft tissue mass left arch.  Pre and Post op course explained. Risks, benefits, alternatives explained. No guarantees given or implied. Surgical booking slip submitted and provided patient with Surgical packet and info for [facility].  -To dispense cam boot at surgical center and patient will call when ready for his date of surgery -Recommend gentle stretching, ice, motrin/tylenol as needed and offloading padding as provided previous until time for surgery -Patient to return to office after surgery or sooner if condition worsens.  Landis Martins, DPM

## 2018-09-03 NOTE — Patient Instructions (Signed)
Pre-Operative Instructions  Congratulations, you have decided to take an important step towards improving your quality of life.  You can be assured that the doctors and staff at [facility] will be with you every step of the way.  Here are some important things you should know:  1. Plan to be at the surgery center/hospital at least 1 (one) hour prior to your scheduled time, unless otherwise directed by the surgical center/hospital staff.  You must have a responsible adult accompany you, remain during the surgery and drive you home.  Make sure you have directions to the surgical center/hospital to ensure you arrive on time. 2. If you are having surgery at [facility] or [facility] hospitals, you will need a copy of your medical history and physical form from your family physician within one month prior to the date of surgery. We will give you a form for your primary physician to complete.  3. We make every effort to accommodate the date you request for surgery.  However, there are times where surgery dates or times have to be moved.  We will contact you as soon as possible if a change in schedule is required.   4. No aspirin/ibuprofen for one week before surgery.  If you are on aspirin, any non-steroidal anti-inflammatory medications (Mobic, Aleve, Ibuprofen) should not be taken seven (7) days prior to your surgery.  You make take Tylenol for pain prior to surgery.  5. Medications - If you are taking daily heart and blood pressure medications, seizure, reflux, allergy, asthma, anxiety, pain or diabetes medications, make sure you notify the surgery center/hospital before the day of surgery so they can tell you which medications you should take or avoid the day of surgery. 6. No food or drink after midnight the night before surgery unless directed otherwise by surgical center/hospital staff. 7. No alcoholic beverages 24-hours prior to surgery.  No smoking 24-hours prior or 24-hours after  surgery. 8. Wear loose pants or shorts. They should be loose enough to fit over bandages, boots, and casts. 9. Don't wear slip-on shoes. Sneakers are preferred. 10. Bring your boot with you to the surgery center/hospital.  Also bring crutches or a walker if your physician has prescribed it for you.  If you do not have this equipment, it will be provided for you after surgery. 11. If you have not been contacted by the surgery center/hospital by the day before your surgery, call to confirm the date and time of your surgery. 12. Leave-time from work may vary depending on the type of surgery you have.  Appropriate arrangements should be made prior to surgery with your employer. 13. Prescriptions will be provided immediately following surgery by your doctor.  Fill these as soon as possible after surgery and take the medication as directed. Pain medications will not be refilled on weekends and must be approved by the doctor. 14. Remove nail polish on the operative foot and avoid getting pedicures prior to surgery. 15. Wash the night before surgery.  The night before surgery wash the foot and leg well with water and the antibacterial soap provided. Be sure to pay special attention to beneath the toenails and in between the toes.  Wash for at least three (3) minutes. Rinse thoroughly with water and dry well with a towel.  Perform this wash unless told not to do so by your physician.  Enclosed: 1 Ice pack (please put in freezer the night before surgery)   1 Hibiclens skin cleaner     Pre-op instructions  If you have any questions regarding the instructions, please do not hesitate to call our office.  [place]: [address], [place], [place] [address] -- [phone]  [place]: [address] [place], [place] [address] -- [phone]  [place]: [address]  [place], [place] [address] -- [phone]  [place]: [address], [address], [place], [place] [address] -- [phone]  Website:  [email or web address] 

## 2018-09-04 ENCOUNTER — Other Ambulatory Visit: Payer: Self-pay | Admitting: General Surgery

## 2018-09-04 DIAGNOSIS — M6208 Separation of muscle (nontraumatic), other site: Secondary | ICD-10-CM

## 2018-09-11 ENCOUNTER — Ambulatory Visit
Admission: RE | Admit: 2018-09-11 | Discharge: 2018-09-11 | Disposition: A | Discharge status: Home / Self Care | Payer: 59 | Source: Ambulatory Visit | Attending: General Surgery | Admitting: General Surgery

## 2018-09-11 DIAGNOSIS — M6208 Separation of muscle (nontraumatic), other site: Secondary | ICD-10-CM

## 2018-09-11 MED ORDER — IOPAMIDOL (ISOVUE-300) INJECTION 61%
100.0000 mL | Freq: Once | INTRAVENOUS | Status: AC | PRN
  Administered 2018-09-11: 100 mL via INTRAVENOUS

## 2018-09-19 ENCOUNTER — Telehealth: Payer: Self-pay | Admitting: Podiatry

## 2018-09-19 NOTE — Telephone Encounter (Signed)
"  I'm calling to schedule my fiance's surgery with Dr. Cannon Kettle."  Dr. Cannon Kettle does surgery on Mondays.  Do you have a date in mind?  "Can we schedule it in December?"  She can do it December 2 or 9.  "Let's schedule it for December 2.  What time will he need to be there."  Someone from the surgical center will give him a call the Friday before the surgery date and they will give him the arrival time.  "Okay, sounds good."

## 2018-09-26 ENCOUNTER — Telehealth: Payer: Self-pay | Admitting: *Deleted

## 2018-09-26 NOTE — Telephone Encounter (Signed)
Yes 3 months for temp handicap placard

## 2018-09-26 NOTE — Telephone Encounter (Signed)
"  Jeremy Gray is scheduled for surgery on December 2.  Can he get a handicap parker sticker?"  Yes, he can.  He can come by the office to get the form and then he'll need to take it to the [facility].  "Can he come by to pick that up at anytime?" Yes, he can.  "You guys are open from 8 am to 5 pm?"  Yes, that is correct.  "You guys are open on Saturdays too aren't you?"  Yes, we are.  "[name] gets off early today.  I'll get him to come by there today to pick it up.  We haven't heard from the surgical center about the arrival time."  He will probably get a call on Wednesday of next week.  "Okay great, thank you so much."  (Okay for a three month placard)

## 2018-09-27 ENCOUNTER — Other Ambulatory Visit: Payer: Self-pay | Admitting: General Surgery

## 2018-10-07 ENCOUNTER — Encounter: Payer: Self-pay | Admitting: Sports Medicine

## 2018-10-07 ENCOUNTER — Other Ambulatory Visit: Payer: Self-pay | Admitting: Sports Medicine

## 2018-10-07 DIAGNOSIS — D492 Neoplasm of unspecified behavior of bone, soft tissue, and skin: Secondary | ICD-10-CM

## 2018-10-07 MED ORDER — HYDROCODONE-ACETAMINOPHEN 10-325 MG PO TABS: 1.0000 | ORAL_TABLET | Freq: Four times a day (QID) | ORAL | 0 refills | Status: DC | PRN

## 2018-10-07 MED ORDER — PROMETHAZINE HCL 25 MG PO TABS: 25.0000 mg | ORAL_TABLET | Freq: Three times a day (TID) | ORAL | 0 refills | Status: DC | PRN

## 2018-10-07 MED ORDER — DOCUSATE SODIUM 100 MG PO CAPS: 100.0000 mg | ORAL_CAPSULE | Freq: Two times a day (BID) | ORAL | 0 refills | Status: DC

## 2018-10-07 MED ORDER — IBUPROFEN 800 MG PO TABS: 800.0000 mg | ORAL_TABLET | Freq: Three times a day (TID) | ORAL | 0 refills | Status: DC | PRN

## 2018-10-07 NOTE — Progress Notes (Signed)
Post op meds sent to pharmacy -Dr. Cannon Kettle

## 2018-10-08 ENCOUNTER — Telehealth: Payer: Self-pay | Admitting: Sports Medicine

## 2018-10-08 NOTE — Telephone Encounter (Signed)
Post op check phone call made to patient. Patient reports that he is doing good. His leg is still numb after block. No pain. I reminded patient to continue with rest, ice, elevation, and taking meds as Rx. Patient encouraged to call office back if there are any problems or concerns. Patient expressed understanding. -Dr. Cannon Kettle

## 2018-10-15 ENCOUNTER — Ambulatory Visit (INDEPENDENT_AMBULATORY_CARE_PROVIDER_SITE_OTHER): Payer: 59 | Admitting: Sports Medicine

## 2018-10-15 ENCOUNTER — Encounter: Payer: Self-pay | Admitting: Sports Medicine

## 2018-10-15 VITALS — Temp 97.7°F

## 2018-10-15 DIAGNOSIS — M79672 Pain in left foot: Secondary | ICD-10-CM

## 2018-10-15 DIAGNOSIS — Z09 Encounter for follow-up examination after completed treatment for conditions other than malignant neoplasm: Secondary | ICD-10-CM

## 2018-10-15 DIAGNOSIS — M60272 Foreign body granuloma of soft tissue, not elsewhere classified, left ankle and foot: Secondary | ICD-10-CM

## 2018-10-15 NOTE — Progress Notes (Signed)
Subjective: Jeremy Gray is a 52 y.o. male patient seen today in office for POV #1 (DOS 10-07-18), S/P excision of mass left foot. Patient denies pain at surgical site, denies calf pain, denies headache, chest pain, shortness of breath, nausea, vomiting, fever, or chills. Patient states that he is doing well and is using boot but getting aggreviated with a burning sensation at lateral left leg. No other issues noted.   Patient Active Problem List   Diagnosis Date Noted  . Encounter for general adult medical examination without abnormal findings 10/26/2011  . Anxiety 09/14/2011    Current Outpatient Medications on File Prior to Visit  Medication Sig Dispense Refill  . amLODipine (NORVASC) 5 MG tablet Take 5 mg by mouth daily.  1  . docusate sodium (COLACE) 100 MG capsule Take 1 capsule (100 mg total) by mouth 2 (two) times daily. (Patient not taking: Reported on 10/14/2018) 10 capsule 0  . HYDROcodone-acetaminophen (NORCO) 10-325 MG tablet Take 1 tablet by mouth every 6 (six) hours as needed. (Patient not taking: Reported on 10/14/2018) 20 tablet 0  . ibuprofen (ADVIL,MOTRIN) 800 MG tablet Take 1 tablet (800 mg total) by mouth every 8 (eight) hours as needed. (Patient taking differently: Take 800 mg by mouth every 8 (eight) hours as needed for headache or moderate pain. ) 30 tablet 0  . losartan (COZAAR) 50 MG tablet Take 50 mg by mouth daily.  1  . promethazine (PHENERGAN) 25 MG tablet Take 1 tablet (25 mg total) by mouth every 8 (eight) hours as needed for nausea or vomiting. (Patient not taking: Reported on 10/14/2018) 20 tablet 0   No current facility-administered medications on file prior to visit.     No Known Allergies  Objective: There were no vitals filed for this visit.  General: No acute distress, AAOx3  Left foot: Sutures intact with no gapping or dehiscence at surgical site, mild swelling to left foot, no erythema, no warmth, no drainage, no signs of infection noted, Capillary  fill time <3 seconds in all digits, gross sensation present via light touch to left foot. No pain or crepitation with range of motion left foot.  No pain with calf compression.   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    -  Primary   Surgery follow-up       Arch pain of left foot       Left foot pain           -Patient seen and evaluated -Applied dry sterile dressing to surgical site left foot secured with ACE wrap and stockinet  -Advised patient to make sure to keep dressings clean, dry, and intact to left surgical site, removing the ACE as needed  -Advised patient to continue with CAM boot and forearm cructches  -Advised patient to limit activity to necessity  -Advised patient to ice and elevate as necessary  -Will plan for possible suture removal at next office visit. In the meantime, patient to call office if any issues or problems arise. To review pathology report when available.   Landis Martins, DPM

## 2018-10-16 ENCOUNTER — Emergency Department (HOSPITAL_COMMUNITY): Payer: 59

## 2018-10-16 ENCOUNTER — Emergency Department (HOSPITAL_COMMUNITY)
Admission: EM | Admit: 2018-10-16 | Discharge: 2018-10-16 | Disposition: A | Discharge status: Home / Self Care | Payer: 59 | Attending: Emergency Medicine | Admitting: Emergency Medicine

## 2018-10-16 ENCOUNTER — Encounter (HOSPITAL_COMMUNITY): Payer: Self-pay

## 2018-10-16 DIAGNOSIS — R350 Frequency of micturition: Secondary | ICD-10-CM | POA: Diagnosis not present

## 2018-10-16 DIAGNOSIS — R110 Nausea: Secondary | ICD-10-CM | POA: Diagnosis not present

## 2018-10-16 DIAGNOSIS — R61 Generalized hyperhidrosis: Secondary | ICD-10-CM | POA: Insufficient documentation

## 2018-10-16 DIAGNOSIS — R42 Dizziness and giddiness: Secondary | ICD-10-CM | POA: Diagnosis not present

## 2018-10-16 DIAGNOSIS — Z79899 Other long term (current) drug therapy: Secondary | ICD-10-CM | POA: Diagnosis not present

## 2018-10-16 DIAGNOSIS — R109 Unspecified abdominal pain: Secondary | ICD-10-CM | POA: Insufficient documentation

## 2018-10-16 LAB — BASIC METABOLIC PANEL
Anion gap: 12 (ref 5–15)
BUN: 9 mg/dL (ref 6–20)
CHLORIDE: 102 mmol/L (ref 98–111)
CO2: 26 mmol/L (ref 22–32)
Calcium: 9 mg/dL (ref 8.9–10.3)
Creatinine, Ser: 1.4 mg/dL — ABNORMAL HIGH (ref 0.61–1.24)
GFR calc Af Amer: >60 mL/min (ref >60)
GFR calc non Af Amer: 58 mL/min — ABNORMAL LOW (ref >60)
Glucose, Bld: 137 mg/dL — ABNORMAL HIGH (ref 70–99)
Potassium: 4 mmol/L (ref 3.5–5.1)
SODIUM: 140 mmol/L (ref 135–145)

## 2018-10-16 LAB — URINALYSIS, ROUTINE W REFLEX MICROSCOPIC
Bilirubin Urine: NEGATIVE
Glucose, UA: NEGATIVE mg/dL
Ketones, ur: NEGATIVE mg/dL
Leukocytes, UA: NEGATIVE
Nitrite: NEGATIVE
Protein, ur: NEGATIVE mg/dL
Specific Gravity, Urine: 1.018 (ref 1.005–1.030)
pH: 7 (ref 5.0–8.0)

## 2018-10-16 LAB — CBC WITH DIFFERENTIAL/PLATELET
Abs Immature Granulocytes: 0.04 10*3/uL (ref 0.00–0.07)
BASOS ABS: 0 10*3/uL (ref 0.0–0.1)
Basophils Relative: 1 %
EOS ABS: 0.2 10*3/uL (ref 0.0–0.5)
Eosinophils Relative: 3 %
HEMATOCRIT: 47.3 % (ref 39.0–52.0)
Hemoglobin: 14.5 g/dL (ref 13.0–17.0)
Immature Granulocytes: 1 %
Lymphocytes Relative: 35 %
Lymphs Abs: 2.3 10*3/uL (ref 0.7–4.0)
MCH: 25.3 pg — ABNORMAL LOW (ref 26.0–34.0)
MCHC: 30.7 g/dL (ref 30.0–36.0)
MCV: 82.5 fL (ref 80.0–100.0)
Monocytes Absolute: 0.6 10*3/uL (ref 0.1–1.0)
Monocytes Relative: 9 %
Neutro Abs: 3.4 10*3/uL (ref 1.7–7.7)
Neutrophils Relative %: 51 %
PLATELETS: 264 10*3/uL (ref 150–400)
RBC: 5.73 MIL/uL (ref 4.22–5.81)
RDW: 12.9 % (ref 11.5–15.5)
WBC: 6.6 10*3/uL (ref 4.0–10.5)
nRBC: 0 % (ref 0.0–0.2)

## 2018-10-16 LAB — CBG MONITORING, ED: Glucose-Capillary: 87 mg/dL (ref 70–99)

## 2018-10-16 MED ORDER — KETOROLAC TROMETHAMINE 30 MG/ML IJ SOLN
15.0000 mg | Freq: Once | INTRAMUSCULAR | Status: AC
  Administered 2018-10-16: 15 mg via INTRAVENOUS
  Filled 2018-10-16: qty 1

## 2018-10-16 MED ORDER — TAMSULOSIN HCL 0.4 MG PO CAPS: 0.4000 mg | ORAL_CAPSULE | Freq: Every day | ORAL | 0 refills | Status: AC

## 2018-10-16 MED ORDER — ONDANSETRON 4 MG PO TBDP: 4.0000 mg | ORAL_TABLET | Freq: Three times a day (TID) | ORAL | 0 refills | Status: DC | PRN

## 2018-10-16 MED ORDER — KETOROLAC TROMETHAMINE 15 MG/ML IJ SOLN
15.0000 mg | Freq: Once | INTRAMUSCULAR | Status: AC
  Administered 2018-10-16: 15 mg via INTRAVENOUS
  Filled 2018-10-16: qty 1

## 2018-10-16 MED ORDER — HYDROMORPHONE HCL 1 MG/ML IJ SOLN
1.0000 mg | Freq: Once | INTRAMUSCULAR | Status: AC
  Administered 2018-10-16: 1 mg via INTRAVENOUS
  Filled 2018-10-16: qty 1

## 2018-10-16 MED ORDER — ONDANSETRON HCL 4 MG/2ML IJ SOLN
4.0000 mg | Freq: Once | INTRAMUSCULAR | Status: AC
  Administered 2018-10-16: 4 mg via INTRAVENOUS
  Filled 2018-10-16: qty 2

## 2018-10-16 MED ORDER — SODIUM CHLORIDE 0.9 % IV BOLUS
500.0000 mL | Freq: Once | INTRAVENOUS | Status: AC
  Administered 2018-10-16: 500 mL via INTRAVENOUS

## 2018-10-16 NOTE — ED Notes (Signed)
Pt with episode of dizziness and diaphoresis.  Given juice/crackers after which cbg was in 80's.

## 2018-10-16 NOTE — ED Triage Notes (Signed)
Pt presents with 2 day h/o L flank pain.  Pt has h/o same with last stone passed 3 years ago.  Pt denies any dysuria but reports foul smelling urine.

## 2018-10-16 NOTE — ED Provider Notes (Signed)
[facility] EMERGENCY DEPARTMENT Provider Note   CSN: [id number] Arrival date & time: 10/16/18  1221     History   Chief Complaint Chief Complaint  Patient presents with  . Flank Pain    HPI Jeremy Gray is a 52 y.o. male.  52 year old male presents with complaint of left flank pain.  Patient states he had slight discomfort yesterday however pain became worse today after taking a nap.  Reports associated nausea and urinary frequency. Denies vomiting, fevers, chills, changes in bowel habits, hematuria.  Patient states pain is similar to pain he has had from prior kidney stones.  Patient states he had a recent CT scan that showed a 2 mm left renal stone and suspects he may be passing the stone.  Patient has had 3 kidney stones previously, all passed without incident/has never needed stent or lithotripsy.     Past Medical History:  Diagnosis Date  . Kidney stone     Patient Active Problem List   Diagnosis Date Noted  . Encounter for general adult medical examination without abnormal findings 10/26/2011  . Anxiety 09/14/2011    History reviewed. No pertinent surgical history.      Home Medications    Prior to Admission medications   Medication Sig Start Date End Date Taking? Authorizing Provider  amLODipine (NORVASC) 5 MG tablet Take 5 mg by mouth daily. 07/01/18   [provider]  docusate sodium (COLACE) 100 MG capsule Take 1 capsule (100 mg total) by mouth 2 (two) times daily. Patient not taking: Reported on 10/14/2018 10/07/18   Landis Martins, DPM  HYDROcodone-acetaminophen (NORCO) 10-325 MG tablet Take 1 tablet by mouth every 6 (six) hours as needed. Patient not taking: Reported on 10/14/2018 10/07/18   Landis Martins, DPM  ibuprofen (ADVIL,MOTRIN) 800 MG tablet Take 1 tablet (800 mg total) by mouth every 8 (eight) hours as needed. Patient taking differently: Take 800 mg by mouth every 8 (eight) hours as needed for headache or moderate  pain.  10/07/18   Landis Martins, DPM  losartan (COZAAR) 50 MG tablet Take 50 mg by mouth daily. 07/01/18   [provider]  ondansetron (ZOFRAN ODT) 4 MG disintegrating tablet Take 1 tablet (4 mg total) by mouth every 8 (eight) hours as needed for nausea or vomiting. 10/16/18   Tacy Learn, PA-C  promethazine (PHENERGAN) 25 MG tablet Take 1 tablet (25 mg total) by mouth every 8 (eight) hours as needed for nausea or vomiting. Patient not taking: Reported on 10/14/2018 10/07/18   Landis Martins, DPM  tamsulosin (FLOMAX) 0.4 MG CAPS capsule Take 1 capsule (0.4 mg total) by mouth daily for 14 days. 10/16/18 10/30/18  Tacy Learn, PA-C    Family History History reviewed. No pertinent family history.  Social History Social History   Tobacco Use  . Smoking status: Never Smoker  . Smokeless tobacco: Never Used  Substance Use Topics  . Alcohol use: No  . Drug use: No     Allergies   Patient has no known allergies.   Review of Systems Review of Systems  Constitutional: Negative for fever.  Gastrointestinal: Positive for nausea. Negative for abdominal pain, constipation, diarrhea and vomiting.  Genitourinary: Positive for flank pain and frequency. Negative for dysuria and hematuria.  Musculoskeletal: Negative for arthralgias and myalgias.  Skin: Negative for rash and wound.  Neurological: Negative for weakness.  Hematological: Does not bruise/bleed easily.  Psychiatric/Behavioral: Negative for confusion.  All other systems reviewed and are negative.  Physical Exam Updated Vital Signs BP 135/72 (BP Location: Right Arm)   Pulse 66   Resp 16   SpO2 98%   Physical Exam  Constitutional: He is oriented to person, place, and time. He appears well-developed and well-nourished. No distress.  HENT:  Head: Normocephalic and atraumatic.  Cardiovascular: Normal rate, regular rhythm, normal heart sounds and intact distal pulses.  No murmur heard. Pulmonary/Chest: Effort  normal and breath sounds normal. No respiratory distress.  Abdominal: Soft. He exhibits no distension. There is no tenderness.  Musculoskeletal: He exhibits no tenderness or deformity.  Neurological: He is alert and oriented to person, place, and time.  Skin: Skin is warm and dry. No rash noted. He is not diaphoretic.  Psychiatric: He has a normal mood and affect. His behavior is normal.  Nursing note and vitals reviewed.    ED Treatments / Results  Labs (all labs ordered are listed, but only abnormal results are displayed) Labs Reviewed  BASIC METABOLIC PANEL - Abnormal; Notable for the following components:      Result Value   Glucose, Bld 137 (*)    Creatinine, Ser 1.40 (*)    GFR calc non Af Amer 58 (*)    All other components within normal limits  CBC WITH DIFFERENTIAL/PLATELET - Abnormal; Notable for the following components:   MCH 25.3 (*)    All other components within normal limits  URINALYSIS, ROUTINE W REFLEX MICROSCOPIC    EKG None  Radiology US Renal  Result Date: 10/16/2018 CLINICAL DATA:  Left flank pain.  History of stones EXAM: RENAL / URINARY TRACT ULTRASOUND COMPLETE COMPARISON:  CT scan of the abdomen and pelvis dated September 11, 2018 FINDINGS: Right Kidney: Renal measurements: 12.7 x 5.1 x 6.2 cm = volume: 209 mL. The renal cortical echotexture remains lower than that of the liver. There is a lower pole cyst containing a faint septation measuring 2.6 x 2.2 x 2.4 cm. There is no hydronephrosis. Left Kidney: Renal measurements: 12.5 x 6.4 x 6.6 cm = volume: 274 mL. Echogenicity within normal limits. No mass or hydronephrosis visualized. Bladder: Appears normal for degree of bladder distention. IMPRESSION: No hydronephrosis either on the right or left. The punctate midpole stone in the left kidney demonstrated on the recent CT scan is not clearly evident on today's study. Tiny lower pole right renal cyst containing a septation. This is similar in appearance to the  CT scan of September 11, 2018. Electronically Signed   By: David  Martinique M.D.   On: 10/16/2018 14:10    Procedures Procedures (including critical care time)  Medications Ordered in ED Medications  sodium chloride 0.9 % bolus 500 mL (0 mLs Intravenous Stopped 10/16/18 1426)  ondansetron (ZOFRAN) injection 4 mg (4 mg Intravenous Given 10/16/18 1252)  HYDROmorphone (DILAUDID) injection 1 mg (1 mg Intravenous Given 10/16/18 1252)  ketorolac (TORADOL) 30 MG/ML injection 15 mg (15 mg Intravenous Given 10/16/18 1426)     Initial Impression / Assessment and Plan / ED Course  I have reviewed the triage vital signs and the nursing notes.  Pertinent labs & imaging results that were available during my care of the patient were reviewed by me and considered in my medical decision making (see chart for details).  Clinical Course as of Oct 16 1457  Wed Oct 16, 5853  [address] 52 year old male with history of kidney stones presents with complaint of left flank pain similar to previous kidney stones.  Exam is unremarkable, abdomen soft nontender, there is no  CVA tenderness, patient is comfortable appearing.  CBC and CMP are without significant changes, patient has baseline elevated creatinine.  Renal ultrasound does not show hydronephrosis, patient recently had a CT scan showing a tiny left-sided kidney stone, this is no longer present on his ultrasound.  Patient has never required intervention for a kidney stone, shared decision making concerning his care today, plan is to avoid CT imaging however if patient develops worsening pain, fever, abnormal kidney stone symptoms he should return to the ER for reevaluation and possible CT at that time.  Pain is controlled at this time after dose of Toradol.  Pending urinalysis to evaluate for infection.  Patient should be discharged to take Flomax, Zofran as needed, Norco as previously prescribed and follow-up with his PCP, return to ER if needed.   [LM]  0721 Care signed  out at shift change to [facility], pending Ua.    [LM]    Clinical Course User Index [LM] Tacy Learn, PA-C   Final Clinical Impressions(s) / ED Diagnoses   Final diagnoses:  Left flank pain    ED Discharge Orders         Ordered    tamsulosin (FLOMAX) 0.4 MG CAPS capsule  Daily     10/16/18 1422    ondansetron (ZOFRAN ODT) 4 MG disintegrating tablet  Every 8 hours PRN     10/16/18 1422           Tacy Learn, PA-C 10/16/18 1458    Davonna Belling, MD 10/16/18 [phone]

## 2018-10-16 NOTE — Pre-Procedure Instructions (Signed)
Pinchus Weckwerth Draughn  10/16/2018      [id number] - [address], [place] [address] [place] [address] Phone: [phone] Fax: [phone]    Your procedure is scheduled on December 20th.  Report to [facility] Admitting at 1030 A.M.  Call this number if you have problems the morning of surgery:  [phone]   Remember:  Do not eat after midnight.  You may drink clear liquids until 0930am .  Clear liquids allowed are:                    Water, Juice (non-citric and without pulp), Carbonated beverages, Clear Tea, Black Coffee only and Gatorade    Take these medicines the morning of surgery with A SIP OF WATER  amLODipine (NORVASC)  7 days prior to surgery STOP taking any Aspirin(unless otherwise instructed by your surgeon), Aleve, Naproxen, Ibuprofen, Motrin, Advil, Goody's, BC's, all herbal medications, fish oil, and all vitamins     Do not wear jewelry.  Do not wear lotions, powders, or colognes, or deodorant.  Men may shave face and neck.  Do not bring valuables to the hospital.  [facility] is not responsible for any belongings or valuables.  Contacts, dentures or bridgework may not be worn into surgery.  Leave your suitcase in the car.  After surgery it may be brought to your room.  For patients admitted to the hospital, discharge time will be determined by your treatment team.  Patients discharged the day of surgery will not be allowed to drive home.    [facility]- Preparing For Surgery  Before surgery, you can play an important role. Because skin is not sterile, your skin needs to be as free of germs as possible. You can reduce the number of germs on your skin by washing with CHG (chlorahexidine gluconate) Soap before surgery.  CHG is an antiseptic cleaner which kills germs and bonds with the skin to continue killing germs even after washing.    Oral Hygiene is also important to reduce your risk of infection.  Remember -  BRUSH YOUR TEETH THE MORNING OF SURGERY WITH YOUR REGULAR TOOTHPASTE  Please do not use if you have an allergy to CHG or antibacterial soaps. If your skin becomes reddened/irritated stop using the CHG.  Do not shave (including legs and underarms) for at least 48 hours prior to first CHG shower. It is OK to shave your face.  Please follow these instructions carefully.   1. Shower the NIGHT BEFORE SURGERY and the MORNING OF SURGERY with CHG.   2. If you chose to wash your hair, wash your hair first as usual with your normal shampoo.  3. After you shampoo, rinse your hair and body thoroughly to remove the shampoo.  4. Use CHG as you would any other liquid soap. You can apply CHG directly to the skin and wash gently with a scrungie or a clean washcloth.   5. Apply the CHG Soap to your body ONLY FROM THE NECK DOWN.  Do not use on open wounds or open sores. Avoid contact with your eyes, ears, mouth and genitals (private parts). Wash Face and genitals (private parts)  with your normal soap.  6. Wash thoroughly, paying special attention to the area where your surgery will be performed.  7. Thoroughly rinse your body with warm water from the neck down.  8. DO NOT shower/wash with your normal soap after using and rinsing off the  CHG Soap.  9. Pat yourself dry with a CLEAN TOWEL.  10. Wear CLEAN PAJAMAS to bed the night before surgery, wear comfortable clothes the morning of surgery  11. Place CLEAN SHEETS on your bed the night of your first shower and DO NOT SLEEP WITH PETS.    Day of Surgery:  Do not apply any deodorants/lotions.  Please wear clean clothes to the hospital/surgery center.   Remember to brush your teeth WITH YOUR REGULAR TOOTHPASTE.    Please read over the following fact sheets that you were given.

## 2018-10-16 NOTE — Discharge Instructions (Signed)
Take Flomax daily as prescribed. Take Zofran as needed as prescribed for nausea and vomiting. Take your Norco as needed as prescribed. AVOID NSAID medications (motrin, aleve, ibuprofen, advil) due to elevated kidney levels (this is not new today).

## 2018-10-16 NOTE — Progress Notes (Signed)
DOS  10/07/2018  Excision of granuloma left foot.

## 2018-10-16 NOTE — ED Provider Notes (Signed)
52 year old male presents today with left-sided flank pain.  Please see previous providers note for full H&P.  Patient's work-up consistent with ureterolithiasis.  He had a 2 mm punctate stone in the left kidney that is not visible now he has blood in his urine with no signs of infection.  I have suspicion that this is passing.  Patient given pain medication here which improved his symptoms discharged with Flomax, Zofran, he has Norco at home.  He will follow-up as an outpatient, he is given strict return precautions.  He verbalized understanding and agreement to today's plan had no further questions or concerns at time discharge.  Vitals:   10/16/18 1233 10/16/18 1535  BP: 135/72 125/78  Pulse: 66 72  Resp: 16 16  Temp:  98 F (36.7 C)  SpO2: 98% 99%      Okey Regal, PA-C 10/16/18 1615    Davonna Belling, MD 10/16/18 [phone]

## 2018-10-17 ENCOUNTER — Encounter (HOSPITAL_COMMUNITY): Payer: Self-pay

## 2018-10-17 ENCOUNTER — Encounter (HOSPITAL_COMMUNITY)
Admission: RE | Admit: 2018-10-17 | Discharge: 2018-10-17 | Disposition: A | Discharge status: Home / Self Care | Payer: 59 | Source: Ambulatory Visit | Attending: General Surgery | Admitting: General Surgery

## 2018-10-17 ENCOUNTER — Other Ambulatory Visit: Payer: Self-pay

## 2018-10-17 DIAGNOSIS — Z01810 Encounter for preprocedural cardiovascular examination: Secondary | ICD-10-CM | POA: Diagnosis not present

## 2018-10-17 HISTORY — DX: Essential (primary) hypertension: I10

## 2018-10-17 HISTORY — DX: Personal history of urinary calculi: Z87.442

## 2018-10-17 NOTE — Progress Notes (Signed)
PCP - Carol Ada Pt denies cardiac workup  EKG - 10/17/18  Pt recently had foot surgery with Dr. Cannon Kettle.   Anesthesia review: FYI pt was in hospital yesterday with kidney stones, pt has not passed stone yet. Labs were drawn in ED. Pt states he will call his PCP and Dr. Cristal Generous office for follow up.   Patient denies shortness of breath, fever, cough and chest pain at PAT appointment   Patient verbalized understanding of instructions that were given to them at the PAT appointment. Patient was also instructed that they will need to review over the PAT instructions again at home before surgery.

## 2018-10-17 NOTE — Progress Notes (Signed)
   10/17/18 0854  OBSTRUCTIVE SLEEP APNEA  Have you ever been diagnosed with sleep apnea through a sleep study? No  Do you snore loudly (loud enough to be heard through closed doors)?  0  Do you often feel tired, fatigued, or sleepy during the daytime (such as falling asleep during driving or talking to someone)? 0  Has anyone observed you stop breathing during your sleep? 0  Do you have, or are you being treated for high blood pressure? 1  BMI more than 35 kg/m2? 1  Age > 50 (1-yes) 1  Neck circumference greater than:Male 16 inches or larger, Male 17inches or larger? 1  Male Gender (Yes=1) 1  Obstructive Sleep Apnea Score 5  Score 5 or greater  Results sent to PCP

## 2018-10-18 NOTE — Progress Notes (Addendum)
Anesthesia Chart Review:  Case:  [id number] Date/Time:  10/25/18 1215   Procedure:  LAPAROSCOPIC VENTRAL HERNIA REPAIR WITH MESH (N/A ) - GENERAL AND BILATERAL TAP BLOCK   Anesthesia type:  General   Pre-op diagnosis:  VENTRAL HERNIA   Location:  [facility] OR ROOM 11 / [place] OR   Surgeon:  Rolm Bookbinder, MD      DISCUSSION: Patient is a 52 year old male scheduled for the above procedure.   History includes never smoker, HTN, nephrolithiasis. He did not report a history of CKD, but labs trends over the years have been mildly elevated (1.28-1.51 in 2012, 1.56 06/01/15, 1.40 10/16/18). He is s/p excision of left foot mass by Landis Martins, DPM on 10/07/18. BMI is consistent with obesity. OSA screening score of 5.  - ED visit 10/16/18 for left flank pain. Had recent CT scan showing a 2 mm left renal stone, and pain was similar to prior experience with kidney stones (all passed without needed stent of lithotripsy). UA showed moderate hemoglobin, but was otherwise negative. Renal U/S did not show any hydronephrosis. Creatinine appeared stable. Provider felt patient was passing kidney stone. He was treated with Flomax, Zofran, and pain medications and given strict return precautions. (At PAT, he reported he would update his PCP and Dr. Donne Hazel to inquire about any follow-up.)  I will request last PCP records for more recent labs trends and plan to follow-up with patient next week to see how he's doing following recent ED visit.   ADDENDUM 10/21/18 1:26 PM: I called and spoke with Jeremy Gray. He passed the kidney stone on 10/19/18. He is now off pain medications and the Flomax. I also received last office note and labs from [facility]. He was last seen for routine follow-up on 09/17/18 by Marilynne Drivers, [place]. She documents that he is being followed for decreased GFR with last labs there on 09/17/18 showing BUN 14, Creatinine 1.29, eGFR 71. Overall, I think his renal function appears stable. If no acute changes  then I anticipate that he can proceed as planned.   VS: BP (!) 151/81   Pulse 88   Temp 36.8 C   Resp 18   Ht _0  (1.778 m)   Wt 117.5 kg   SpO2 99%   BMI 37.16 kg/m   PROVIDERS: Carol Ada, MD is PCP   LABS: Labs on 10/16/18 included: Lab Results  Component Value Date   WBC 6.6 10/16/2018   HGB 14.5 10/16/2018   HCT 47.3 10/16/2018   PLT 264 10/16/2018   GLUCOSE 137 (H) 10/16/2018   NA 140 10/16/2018   K 4.0 10/16/2018   CL 102 10/16/2018   CREATININE 1.40 (H) 10/16/2018   BUN 9 10/16/2018   CO2 26 10/16/2018   Lab Results  Component Value Date   CREATININE 1.40 (H) 10/16/2018   CREATININE 1.56 (H) 06/01/2015   CREATININE 1.51 (H) 02/19/2011  In [facility] Everywhere: Creatinine 1.28 09/14/11 and 1.29 10/21/11.  IMAGES: Renal U/S 10/16/18: IMPRESSION: - No hydronephrosis either on the right or left. The punctate midpole stone in the left kidney demonstrated on the recent CT scan is not clearly evident on today's study. - Tiny lower pole right renal cyst containing a septation. This is similar in appearance to the CT scan of September 11, 2018.  CT abd/pelvis 09/11/18: IMPRESSION: 1. Supraumbilical midline ventral hernia as described above. 2. Mild separation of the supraumbilical rectus muscles, separated by 4-5 cm. 3. Small umbilical hernia contains only  fat. 4. 2 mm nonobstructing stone upper pole left kidney.   EKG: 10/17/18: NSR   CV: N/A  Past Medical History:  Diagnosis Date  . History of kidney stones   . Hypertension   . Kidney stone     Past Surgical History:  Procedure Laterality Date  . FOOT SURGERY Left     MEDICATIONS: . amLODipine (NORVASC) 5 MG tablet  . docusate sodium (COLACE) 100 MG capsule  . HYDROcodone-acetaminophen (NORCO) 10-325 MG tablet  . ibuprofen (ADVIL,MOTRIN) 800 MG tablet  . losartan (COZAAR) 50 MG tablet  . ondansetron (ZOFRAN ODT) 4 MG disintegrating tablet  . promethazine (PHENERGAN) 25 MG tablet   . tamsulosin (FLOMAX) 0.4 MG CAPS capsule   No current facility-administered medications for this encounter.     George Hugh [facility] Short Stay Center/Anesthesiology Phone [phone] 10/18/2018 5:05 PM

## 2018-10-22 ENCOUNTER — Ambulatory Visit (INDEPENDENT_AMBULATORY_CARE_PROVIDER_SITE_OTHER): Payer: 59 | Admitting: Sports Medicine

## 2018-10-22 ENCOUNTER — Encounter: Payer: Self-pay | Admitting: Sports Medicine

## 2018-10-22 DIAGNOSIS — M79672 Pain in left foot: Secondary | ICD-10-CM

## 2018-10-22 DIAGNOSIS — M60272 Foreign body granuloma of soft tissue, not elsewhere classified, left ankle and foot: Secondary | ICD-10-CM

## 2018-10-22 DIAGNOSIS — Z09 Encounter for follow-up examination after completed treatment for conditions other than malignant neoplasm: Secondary | ICD-10-CM

## 2018-10-22 NOTE — Progress Notes (Signed)
Subjective: Jeremy Gray is a 52 y.o. male patient seen today in office for POV #2 (DOS 10-07-18), S/P excision of mass left foot. Patient denies pain at surgical site, however it reports that he did have a kidney stone will be going for hernia surgery on Friday.  Reports that his foot is doing good with no pain or problems.  Patient denies calf pain, denies headache, chest pain, shortness of breath, nausea, vomiting, fever, or chills. No other issues noted.   Patient Active Problem List   Diagnosis Date Noted  . Encounter for general adult medical examination without abnormal findings 10/26/2011  . Anxiety 09/14/2011    Current Outpatient Medications on File Prior to Visit  Medication Sig Dispense Refill  . amLODipine (NORVASC) 5 MG tablet Take 5 mg by mouth daily.  1  . docusate sodium (COLACE) 100 MG capsule Take 1 capsule (100 mg total) by mouth 2 (two) times daily. (Patient not taking: Reported on 10/14/2018) 10 capsule 0  . HYDROcodone-acetaminophen (NORCO) 10-325 MG tablet Take 1 tablet by mouth every 6 (six) hours as needed. (Patient not taking: Reported on 10/14/2018) 20 tablet 0  . ibuprofen (ADVIL,MOTRIN) 800 MG tablet Take 1 tablet (800 mg total) by mouth every 8 (eight) hours as needed. (Patient taking differently: Take 800 mg by mouth every 8 (eight) hours as needed for headache or moderate pain. ) 30 tablet 0  . losartan (COZAAR) 50 MG tablet Take 50 mg by mouth daily.  1  . ondansetron (ZOFRAN ODT) 4 MG disintegrating tablet Take 1 tablet (4 mg total) by mouth every 8 (eight) hours as needed for nausea or vomiting. 12 tablet 0  . promethazine (PHENERGAN) 25 MG tablet Take 1 tablet (25 mg total) by mouth every 8 (eight) hours as needed for nausea or vomiting. (Patient not taking: Reported on 10/14/2018) 20 tablet 0  . tamsulosin (FLOMAX) 0.4 MG CAPS capsule Take 1 capsule (0.4 mg total) by mouth daily for 14 days. 30 capsule 0   No current facility-administered medications on file  prior to visit.     No Known Allergies  Objective: There were no vitals filed for this visit.  General: No acute distress, AAOx3  Left foot: Sutures intact with no gapping or dehiscence at surgical site, mild swelling to left foot, no erythema, no warmth, no drainage, no signs of infection noted, Capillary fill time <3 seconds in all digits, gross sensation present via light touch to left foot. No pain or crepitation with range of motion left foot.  No pain with calf compression.   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    -  Primary   Pathology revealed hemangioma   Surgery follow-up       Arch pain of left foot       Left foot pain           -Patient seen and evaluated -Pathology report reviewed consistent with hemangioma resected -Sutures removed -Applied dry sterile dressing to surgical site left foot secured with ACE wrap and stockinet  -May remove dressing on tomorrow to shower and advised patient to redress area with Ace wrap -Advised patient to continue with CAM boot and forearm cructches may slowly wean from crutches over period of 1 week -Advised patient to limit activity to necessity  -Advised patient to ice and elevate as necessary  -  Will plan for transitioning patient to postoperative shoe at next office visit. Landis Martins, DPM

## 2018-10-24 MED ORDER — DEXTROSE 5 % IV SOLN
3.0000 g | INTRAVENOUS | Status: AC
  Administered 2018-10-25: 3 g via INTRAVENOUS
  Filled 2018-10-24: qty 3

## 2018-10-25 ENCOUNTER — Ambulatory Visit (HOSPITAL_COMMUNITY): Payer: 59 | Admitting: Vascular Surgery

## 2018-10-25 ENCOUNTER — Ambulatory Visit (HOSPITAL_COMMUNITY): Payer: 59 | Admitting: Anesthesiology

## 2018-10-25 ENCOUNTER — Encounter (HOSPITAL_COMMUNITY): Payer: Self-pay | Admitting: Critical Care Medicine

## 2018-10-25 ENCOUNTER — Encounter (HOSPITAL_COMMUNITY)
Admission: RE | Disposition: A | Discharge status: Home / Self Care | Payer: Self-pay | Source: Home / Self Care | Attending: General Surgery

## 2018-10-25 ENCOUNTER — Observation Stay (HOSPITAL_COMMUNITY)
Admission: RE | Admit: 2018-10-25 | Discharge: 2018-10-26 | Disposition: A | Discharge status: Home / Self Care | Payer: 59 | Attending: General Surgery | Admitting: General Surgery

## 2018-10-25 DIAGNOSIS — E669 Obesity, unspecified: Secondary | ICD-10-CM | POA: Diagnosis not present

## 2018-10-25 DIAGNOSIS — K429 Umbilical hernia without obstruction or gangrene: Secondary | ICD-10-CM | POA: Diagnosis not present

## 2018-10-25 DIAGNOSIS — K439 Ventral hernia without obstruction or gangrene: Secondary | ICD-10-CM | POA: Diagnosis present

## 2018-10-25 DIAGNOSIS — Z79899 Other long term (current) drug therapy: Secondary | ICD-10-CM | POA: Diagnosis not present

## 2018-10-25 DIAGNOSIS — M6208 Separation of muscle (nontraumatic), other site: Secondary | ICD-10-CM | POA: Diagnosis not present

## 2018-10-25 DIAGNOSIS — I10 Essential (primary) hypertension: Secondary | ICD-10-CM | POA: Insufficient documentation

## 2018-10-25 DIAGNOSIS — Z6837 Body mass index (BMI) 37.0-37.9, adult: Secondary | ICD-10-CM | POA: Diagnosis not present

## 2018-10-25 HISTORY — PX: INSERTION OF MESH: SHX5868

## 2018-10-25 HISTORY — PX: VENTRAL HERNIA REPAIR: SHX424

## 2018-10-25 SURGERY — REPAIR, HERNIA, VENTRAL, LAPAROSCOPIC
Anesthesia: General | Site: Abdomen

## 2018-10-25 MED ORDER — SUGAMMADEX SODIUM 200 MG/2ML IV SOLN
INTRAVENOUS | Status: DC | PRN
  Administered 2018-10-25: 235 mg via INTRAVENOUS

## 2018-10-25 MED ORDER — OXYCODONE HCL 5 MG PO TABS
5.0000 mg | ORAL_TABLET | ORAL | Status: DC | PRN
  Administered 2018-10-26: 10 mg via ORAL
  Filled 2018-10-25 (×2): qty 2

## 2018-10-25 MED ORDER — LOSARTAN POTASSIUM 50 MG PO TABS: 50.0000 mg | ORAL_TABLET | Freq: Every day | ORAL | Status: DC

## 2018-10-25 MED ORDER — OXYCODONE HCL 5 MG PO TABS: 5.0000 mg | ORAL_TABLET | Freq: Four times a day (QID) | ORAL | 0 refills | Status: DC | PRN

## 2018-10-25 MED ORDER — MIDAZOLAM HCL 5 MG/5ML IJ SOLN
INTRAMUSCULAR | Status: DC | PRN
  Administered 2018-10-25: 2 mg via INTRAVENOUS

## 2018-10-25 MED ORDER — METHOCARBAMOL 500 MG PO TABS
500.0000 mg | ORAL_TABLET | Freq: Four times a day (QID) | ORAL | Status: DC | PRN
  Administered 2018-10-25 – 2018-10-26 (×2): 500 mg via ORAL
  Filled 2018-10-25 (×2): qty 1

## 2018-10-25 MED ORDER — DOCUSATE SODIUM 100 MG PO CAPS
100.0000 mg | ORAL_CAPSULE | Freq: Two times a day (BID) | ORAL | Status: DC
  Administered 2018-10-25 – 2018-10-26 (×2): 100 mg via ORAL
  Filled 2018-10-25 (×2): qty 1

## 2018-10-25 MED ORDER — ROCURONIUM BROMIDE 50 MG/5ML IV SOSY
PREFILLED_SYRINGE | INTRAVENOUS | Status: AC
  Filled 2018-10-25: qty 10

## 2018-10-25 MED ORDER — FENTANYL CITRATE (PF) 100 MCG/2ML IJ SOLN: 25.0000 ug | INTRAMUSCULAR | Status: DC | PRN

## 2018-10-25 MED ORDER — MIDAZOLAM HCL 2 MG/2ML IJ SOLN
2.0000 mg | Freq: Once | INTRAMUSCULAR | Status: AC
  Administered 2018-10-25: 2 mg via INTRAVENOUS

## 2018-10-25 MED ORDER — SODIUM CHLORIDE 0.9 % IV SOLN
INTRAVENOUS | Status: DC
  Administered 2018-10-25: 17:00:00 via INTRAVENOUS

## 2018-10-25 MED ORDER — HYDRALAZINE HCL 20 MG/ML IJ SOLN: 10.0000 mg | INTRAMUSCULAR | Status: DC | PRN

## 2018-10-25 MED ORDER — BISACODYL 10 MG RE SUPP: 10.0000 mg | Freq: Every day | RECTAL | Status: DC | PRN

## 2018-10-25 MED ORDER — FENTANYL CITRATE (PF) 250 MCG/5ML IJ SOLN
INTRAMUSCULAR | Status: DC | PRN
  Administered 2018-10-25 (×3): 50 ug via INTRAVENOUS

## 2018-10-25 MED ORDER — FENTANYL CITRATE (PF) 100 MCG/2ML IJ SOLN
50.0000 ug | Freq: Once | INTRAMUSCULAR | Status: AC
  Administered 2018-10-25: 50 ug via INTRAVENOUS

## 2018-10-25 MED ORDER — ONDANSETRON HCL 4 MG/2ML IJ SOLN
INTRAMUSCULAR | Status: AC
  Filled 2018-10-25: qty 2

## 2018-10-25 MED ORDER — LIDOCAINE 2% (20 MG/ML) 5 ML SYRINGE
INTRAMUSCULAR | Status: AC
  Filled 2018-10-25: qty 5

## 2018-10-25 MED ORDER — SODIUM CHLORIDE 0.9 % IV SOLN
INTRAVENOUS | Status: DC | PRN
  Administered 2018-10-25: 25 ug/min via INTRAVENOUS

## 2018-10-25 MED ORDER — SIMETHICONE 80 MG PO CHEW: 40.0000 mg | CHEWABLE_TABLET | Freq: Four times a day (QID) | ORAL | Status: DC | PRN

## 2018-10-25 MED ORDER — ENOXAPARIN SODIUM 40 MG/0.4ML ~~LOC~~ SOLN
40.0000 mg | SUBCUTANEOUS | Status: DC
  Administered 2018-10-26: 40 mg via SUBCUTANEOUS
  Filled 2018-10-25: qty 0.4

## 2018-10-25 MED ORDER — OXYCODONE HCL 5 MG/5ML PO SOLN: 5.0000 mg | Freq: Once | ORAL | Status: DC | PRN

## 2018-10-25 MED ORDER — BUPIVACAINE HCL (PF) 0.25 % IJ SOLN
INTRAMUSCULAR | Status: AC
  Filled 2018-10-25: qty 30

## 2018-10-25 MED ORDER — ONDANSETRON 4 MG PO TBDP: 4.0000 mg | ORAL_TABLET | Freq: Four times a day (QID) | ORAL | Status: DC | PRN

## 2018-10-25 MED ORDER — ONDANSETRON HCL 4 MG/2ML IJ SOLN: 4.0000 mg | Freq: Four times a day (QID) | INTRAMUSCULAR | Status: DC | PRN

## 2018-10-25 MED ORDER — DEXAMETHASONE SODIUM PHOSPHATE 10 MG/ML IJ SOLN
INTRAMUSCULAR | Status: AC
  Filled 2018-10-25: qty 1

## 2018-10-25 MED ORDER — 0.9 % SODIUM CHLORIDE (POUR BTL) OPTIME
TOPICAL | Status: DC | PRN
  Administered 2018-10-25: 1000 mL

## 2018-10-25 MED ORDER — PROPOFOL 10 MG/ML IV BOLUS
INTRAVENOUS | Status: AC
  Filled 2018-10-25: qty 20

## 2018-10-25 MED ORDER — LIDOCAINE 2% (20 MG/ML) 5 ML SYRINGE
INTRAMUSCULAR | Status: DC | PRN
  Administered 2018-10-25: 100 mg via INTRAVENOUS

## 2018-10-25 MED ORDER — ARTIFICIAL TEARS OPHTHALMIC OINT
TOPICAL_OINTMENT | OPHTHALMIC | Status: AC
  Filled 2018-10-25: qty 3.5

## 2018-10-25 MED ORDER — ACETAMINOPHEN 500 MG PO TABS
1000.0000 mg | ORAL_TABLET | ORAL | Status: AC
  Administered 2018-10-25: 1000 mg via ORAL
  Filled 2018-10-25: qty 2

## 2018-10-25 MED ORDER — BUPIVACAINE-EPINEPHRINE (PF) 0.25% -1:200000 IJ SOLN
INTRAMUSCULAR | Status: AC
  Filled 2018-10-25: qty 30

## 2018-10-25 MED ORDER — KETOROLAC TROMETHAMINE 15 MG/ML IJ SOLN
15.0000 mg | INTRAMUSCULAR | Status: AC
  Administered 2018-10-25: 15 mg via INTRAVENOUS
  Filled 2018-10-25: qty 1

## 2018-10-25 MED ORDER — FENTANYL CITRATE (PF) 250 MCG/5ML IJ SOLN
INTRAMUSCULAR | Status: AC
  Filled 2018-10-25: qty 5

## 2018-10-25 MED ORDER — TAMSULOSIN HCL 0.4 MG PO CAPS: 0.4000 mg | ORAL_CAPSULE | Freq: Every day | ORAL | Status: DC

## 2018-10-25 MED ORDER — BUPIVACAINE HCL (PF) 0.25 % IJ SOLN
INTRAMUSCULAR | Status: DC | PRN
  Administered 2018-10-25: 30 mL

## 2018-10-25 MED ORDER — BUPIVACAINE HCL (PF) 0.25 % IJ SOLN
INTRAMUSCULAR | Status: DC | PRN
  Administered 2018-10-25 (×2): 20 mL

## 2018-10-25 MED ORDER — ENSURE PRE-SURGERY PO LIQD: 296.0000 mL | Freq: Once | ORAL | Status: DC

## 2018-10-25 MED ORDER — PHENYLEPHRINE 40 MCG/ML (10ML) SYRINGE FOR IV PUSH (FOR BLOOD PRESSURE SUPPORT)
PREFILLED_SYRINGE | INTRAVENOUS | Status: DC | PRN
  Administered 2018-10-25 (×2): 40 ug via INTRAVENOUS
  Administered 2018-10-25: 80 ug via INTRAVENOUS
  Administered 2018-10-25: 40 ug via INTRAVENOUS
  Administered 2018-10-25: 80 ug via INTRAVENOUS
  Administered 2018-10-25: 40 ug via INTRAVENOUS

## 2018-10-25 MED ORDER — FENTANYL CITRATE (PF) 100 MCG/2ML IJ SOLN
INTRAMUSCULAR | Status: AC
  Administered 2018-10-25: 50 ug via INTRAVENOUS
  Filled 2018-10-25: qty 2

## 2018-10-25 MED ORDER — ONDANSETRON HCL 4 MG/2ML IJ SOLN
INTRAMUSCULAR | Status: DC | PRN
  Administered 2018-10-25: 4 mg via INTRAVENOUS

## 2018-10-25 MED ORDER — ROCURONIUM BROMIDE 50 MG/5ML IV SOSY
PREFILLED_SYRINGE | INTRAVENOUS | Status: AC
  Filled 2018-10-25: qty 5

## 2018-10-25 MED ORDER — PROMETHAZINE HCL 25 MG/ML IJ SOLN: 6.2500 mg | INTRAMUSCULAR | Status: DC | PRN

## 2018-10-25 MED ORDER — MIDAZOLAM HCL 2 MG/2ML IJ SOLN
INTRAMUSCULAR | Status: AC
  Administered 2018-10-25: 2 mg via INTRAVENOUS
  Filled 2018-10-25: qty 2

## 2018-10-25 MED ORDER — MIDAZOLAM HCL 2 MG/2ML IJ SOLN
INTRAMUSCULAR | Status: AC
  Filled 2018-10-25: qty 2

## 2018-10-25 MED ORDER — BUPIVACAINE LIPOSOME 1.3 % IJ SUSP
INTRAMUSCULAR | Status: DC | PRN
  Administered 2018-10-25 (×2): 10 mL

## 2018-10-25 MED ORDER — DEXAMETHASONE SODIUM PHOSPHATE 10 MG/ML IJ SOLN
INTRAMUSCULAR | Status: DC | PRN
  Administered 2018-10-25: 4 mg via INTRAVENOUS

## 2018-10-25 MED ORDER — LACTATED RINGERS IV SOLN
INTRAVENOUS | Status: DC
  Administered 2018-10-25: 11:00:00 via INTRAVENOUS

## 2018-10-25 MED ORDER — OXYCODONE HCL 5 MG PO TABS: 5.0000 mg | ORAL_TABLET | Freq: Once | ORAL | Status: DC | PRN

## 2018-10-25 MED ORDER — PROPOFOL 10 MG/ML IV BOLUS
INTRAVENOUS | Status: DC | PRN
  Administered 2018-10-25: 180 mg via INTRAVENOUS

## 2018-10-25 MED ORDER — AMLODIPINE BESYLATE 5 MG PO TABS
5.0000 mg | ORAL_TABLET | Freq: Every day | ORAL | Status: DC
  Administered 2018-10-25 – 2018-10-26 (×2): 5 mg via ORAL
  Filled 2018-10-25 (×2): qty 1

## 2018-10-25 MED ORDER — ACETAMINOPHEN 500 MG PO TABS
1000.0000 mg | ORAL_TABLET | Freq: Four times a day (QID) | ORAL | Status: DC
  Administered 2018-10-25 – 2018-10-26 (×3): 1000 mg via ORAL
  Filled 2018-10-25 (×3): qty 2

## 2018-10-25 MED ORDER — MORPHINE SULFATE (PF) 2 MG/ML IV SOLN
2.0000 mg | INTRAVENOUS | Status: DC | PRN
  Administered 2018-10-25: 2 mg via INTRAVENOUS
  Filled 2018-10-25: qty 1

## 2018-10-25 MED ORDER — PHENYLEPHRINE 40 MCG/ML (10ML) SYRINGE FOR IV PUSH (FOR BLOOD PRESSURE SUPPORT)
PREFILLED_SYRINGE | INTRAVENOUS | Status: AC
  Filled 2018-10-25: qty 10

## 2018-10-25 MED ORDER — GABAPENTIN 100 MG PO CAPS
100.0000 mg | ORAL_CAPSULE | ORAL | Status: AC
  Administered 2018-10-25: 100 mg via ORAL
  Filled 2018-10-25: qty 1

## 2018-10-25 MED ORDER — ROCURONIUM BROMIDE 10 MG/ML (PF) SYRINGE
PREFILLED_SYRINGE | INTRAVENOUS | Status: DC | PRN
  Administered 2018-10-25 (×2): 20 mg via INTRAVENOUS
  Administered 2018-10-25: 50 mg via INTRAVENOUS

## 2018-10-25 MED ORDER — KETOROLAC TROMETHAMINE 15 MG/ML IJ SOLN
15.0000 mg | Freq: Four times a day (QID) | INTRAMUSCULAR | Status: DC | PRN
  Administered 2018-10-25: 15 mg via INTRAVENOUS
  Filled 2018-10-25: qty 1

## 2018-10-25 SURGICAL SUPPLY — 58 items
ADH SKN CLS APL DERMABOND .7 (GAUZE/BANDAGES/DRESSINGS) ×4
APPLIER CLIP 5 13 M/L LIGAMAX5 (MISCELLANEOUS)
APR CLP MED LRG 5 ANG JAW (MISCELLANEOUS)
BINDER ABDOMINAL 12 ML 46-62 (SOFTGOODS) IMPLANT
BNDG GAUZE ELAST 4 BULKY (GAUZE/BANDAGES/DRESSINGS) IMPLANT
CANISTER SUCT 3000ML PPV (MISCELLANEOUS) IMPLANT
CHLORAPREP W/TINT 26ML (MISCELLANEOUS) ×4 IMPLANT
CLIP APPLIE 5 13 M/L LIGAMAX5 (MISCELLANEOUS) IMPLANT
CLOSURE WOUND 1/2 X4 (GAUZE/BANDAGES/DRESSINGS) ×1
COVER SURGICAL LIGHT HANDLE (MISCELLANEOUS) ×4 IMPLANT
COVER WAND RF STERILE (DRAPES) ×2 IMPLANT
DERMABOND ADVANCED (GAUZE/BANDAGES/DRESSINGS) ×4
DERMABOND ADVANCED .7 DNX12 (GAUZE/BANDAGES/DRESSINGS) ×2 IMPLANT
DEVICE RELIATACK FIXATION (MISCELLANEOUS) ×2 IMPLANT
DEVICE SECURE STRAP 25 ABSORB (INSTRUMENTS) ×6 IMPLANT
DEVICE TROCAR PUNCTURE CLOSURE (ENDOMECHANICALS) ×4 IMPLANT
DRAPE INCISE IOBAN 66X45 STRL (DRAPES) ×4 IMPLANT
ELECT REM PT RETURN 9FT ADLT (ELECTROSURGICAL) ×4
ELECTRODE REM PT RTRN 9FT ADLT (ELECTROSURGICAL) ×2 IMPLANT
GLOVE BIO SURGEON STRL SZ7 (GLOVE) ×4 IMPLANT
GLOVE BIO SURGEON STRL SZ7.5 (GLOVE) ×2 IMPLANT
GLOVE BIOGEL PI IND STRL 7.0 (GLOVE) IMPLANT
GLOVE BIOGEL PI IND STRL 7.5 (GLOVE) ×2 IMPLANT
GLOVE BIOGEL PI INDICATOR 7.0 (GLOVE) ×2
GLOVE BIOGEL PI INDICATOR 7.5 (GLOVE) ×2
GLOVE SURG SS PI 6.5 STRL IVOR (GLOVE) ×2 IMPLANT
GOWN STRL REUS W/ TWL LRG LVL3 (GOWN DISPOSABLE) ×6 IMPLANT
GOWN STRL REUS W/TWL LRG LVL3 (GOWN DISPOSABLE) ×12
KIT BASIN OR (CUSTOM PROCEDURE TRAY) ×4 IMPLANT
KIT TURNOVER KIT B (KITS) ×4 IMPLANT
MARKER SKIN DUAL TIP RULER LAB (MISCELLANEOUS) ×4 IMPLANT
MESH VENTRALIGHT ST 6X8 (Mesh Specialty) ×4 IMPLANT
MESH VENTRLGHT ELLIPSE 8X6XMFL (Mesh Specialty) IMPLANT
NDL SPNL 22GX3.5 QUINCKE BK (NEEDLE) ×2 IMPLANT
NEEDLE SPNL 22GX3.5 QUINCKE BK (NEEDLE) ×4 IMPLANT
NS IRRIG 1000ML POUR BTL (IV SOLUTION) ×4 IMPLANT
PAD ARMBOARD 7.5X6 YLW CONV (MISCELLANEOUS) ×8 IMPLANT
RELOAD ENDO RELIATCK 10 HERNIA (MISCELLANEOUS) IMPLANT
RELOAD ENDO RELIATCK 5 HERNIA (MISCELLANEOUS) IMPLANT
RELOAD RELIATACK 10 (MISCELLANEOUS) IMPLANT
RELOAD RELIATACK 5 (MISCELLANEOUS) IMPLANT
SCISSORS LAP 5X35 DISP (ENDOMECHANICALS) IMPLANT
SET IRRIG TUBING LAPAROSCOPIC (IRRIGATION / IRRIGATOR) IMPLANT
SHEARS HARMONIC ACE PLUS 36CM (ENDOMECHANICALS) ×2 IMPLANT
SLEEVE ENDOPATH XCEL 5M (ENDOMECHANICALS) ×8 IMPLANT
STRIP CLOSURE SKIN 1/2X4 (GAUZE/BANDAGES/DRESSINGS) ×3 IMPLANT
SUT MNCRL AB 4-0 PS2 18 (SUTURE) ×4 IMPLANT
SUT VIC AB 0 CT1 27 (SUTURE) ×8
SUT VIC AB 0 CT1 27XBRD ANBCTR (SUTURE) ×4 IMPLANT
TOWEL OR 17X24 6PK STRL BLUE (TOWEL DISPOSABLE) ×4 IMPLANT
TOWEL OR 17X26 10 PK STRL BLUE (TOWEL DISPOSABLE) ×4 IMPLANT
TRAY FOLEY CATH SILVER 16FR (SET/KITS/TRAYS/PACK) ×4 IMPLANT
TRAY LAPAROSCOPIC MC (CUSTOM PROCEDURE TRAY) ×4 IMPLANT
TROCAR XCEL BLUNT TIP 100MML (ENDOMECHANICALS) IMPLANT
TROCAR XCEL NON-BLD 11X100MML (ENDOMECHANICALS) ×2 IMPLANT
TROCAR XCEL NON-BLD 5MMX100MML (ENDOMECHANICALS) ×4 IMPLANT
TUBING INSUFFLATION (TUBING) ×4 IMPLANT
WATER STERILE IRR 1000ML POUR (IV SOLUTION) ×4 IMPLANT

## 2018-10-25 NOTE — Anesthesia Procedure Notes (Signed)
Anesthesia Regional Block: TAP block   Pre-Anesthetic Checklist: ,, timeout performed, Correct Patient, Correct Site, Correct Laterality, Correct Procedure, Correct Position, site marked, Risks and benefits discussed,  Surgical consent,  Pre-op evaluation,  At surgeon's request and post-op pain management  Laterality: Right  Prep: chloraprep       Needles:  Injection technique: Single-shot  Needle Type: Echogenic Needle     Needle Length: 9cm  Needle Gauge: 21     Additional Needles:   Narrative:  Start time: 10/25/2018 11:54 AM End time: 10/25/2018 11:58 AM Injection made incrementally with aspirations every 5 mL.  Performed by: Personally  Anesthesiologist: Audry Pili, MD  Additional Notes: No pain on injection. No increased resistance to injection. Injection made in 5cc increments. Good needle visualization. Patient tolerated the procedure well.

## 2018-10-25 NOTE — H&P (Signed)
Jeremy Gray is an 52 y.o. male.   Chief Complaint: hernia HPI: 52 yof referred by Dr Tamala Julian for possible hernia. no prior abd surgery. he has noted for some time a bulge above umbilicus with straining or activity. this gives some discomfort and is somewhat worse with certain foods. no change in bms. I saw him previously and thought he had a small uh and likely a large area of diastasis. i sent him for ct scan that shows a supraumbilical ventral hernia with a fascial defect that measures 2.4x1.5 cm with a larger hernia sac. the rectus muscles do have a diastasis.  Past Medical History:  Diagnosis Date  . History of kidney stones   . Hypertension   . Kidney stone     Past Surgical History:  Procedure Laterality Date  . FOOT SURGERY Left     No family history on file. Social History:  reports that he has never smoked. He has never used smokeless tobacco. He reports current alcohol use. He reports that he does not use drugs.  Allergies: No Known Allergies  Medications Prior to Admission  Medication Sig Dispense Refill  . amLODipine (NORVASC) 5 MG tablet Take 5 mg by mouth daily.  1  . ibuprofen (ADVIL,MOTRIN) 800 MG tablet Take 1 tablet (800 mg total) by mouth every 8 (eight) hours as needed. (Patient taking differently: Take 800 mg by mouth every 8 (eight) hours as needed for headache or moderate pain. ) 30 tablet 0  . losartan (COZAAR) 50 MG tablet Take 50 mg by mouth daily.  1  . docusate sodium (COLACE) 100 MG capsule Take 1 capsule (100 mg total) by mouth 2 (two) times daily. (Patient not taking: Reported on 10/14/2018) 10 capsule 0  . HYDROcodone-acetaminophen (NORCO) 10-325 MG tablet Take 1 tablet by mouth every 6 (six) hours as needed. (Patient not taking: Reported on 10/14/2018) 20 tablet 0  . ondansetron (ZOFRAN ODT) 4 MG disintegrating tablet Take 1 tablet (4 mg total) by mouth every 8 (eight) hours as needed for nausea or vomiting. 12 tablet 0  . promethazine (PHENERGAN) 25 MG  tablet Take 1 tablet (25 mg total) by mouth every 8 (eight) hours as needed for nausea or vomiting. (Patient not taking: Reported on 10/14/2018) 20 tablet 0  . tamsulosin (FLOMAX) 0.4 MG CAPS capsule Take 1 capsule (0.4 mg total) by mouth daily for 14 days. 30 capsule 0    No results found for this or any previous visit (from the past 48 hour(s)). No results found.  ROS General Not Present- Appetite Loss, Chills, Fatigue, Fever, Night Sweats, Weight Gain and Weight Loss. Skin Not Present- Change in Wart/Mole, Dryness, Hives, Jaundice, New Lesions, Non-Healing Wounds, Rash and Ulcer. HEENT Not Present- Earache, Hearing Loss, Hoarseness, Nose Bleed, Oral Ulcers, Ringing in the Ears, Seasonal Allergies, Sinus Pain, Sore Throat, Visual Disturbances, Wears glasses/contact lenses and Yellow Eyes. Respiratory Not Present- Bloody sputum, Chronic Cough, Difficulty Breathing, Snoring and Wheezing. Gastrointestinal Present- Abdominal Pain. Not Present- Bloating, Bloody Stool, Change in Bowel Habits, Chronic diarrhea, Constipation, Difficulty Swallowing, Excessive gas, Gets full quickly at meals, Hemorrhoids, Indigestion, Nausea, Rectal Pain and Vomiting. Male Genitourinary Not Present- Blood in Urine, Change in Urinary Stream, Frequency, Impotence, Nocturia, Painful Urination, Urgency and Urine Leakage. There were no vitals taken for this visit. Physical Exam  General Mental Status-Alert. Orientation-Oriented X3. Abdomen Note: soft nontender diastasis recti,  umbilical hernia at inferior portion of this cv rrr Lungs clear  Assessment/Plan Ventral hernia Discussed options  and have elected to proceed with lvh with mesh.  Risks and recovery both discussed with patient  Rolm Bookbinder, MD 10/25/2018, 10:28 AM

## 2018-10-25 NOTE — Anesthesia Procedure Notes (Signed)
Procedure Name: Intubation Date/Time: 10/25/2018 12:36 PM Performed by: Wilburn Cornelia, CRNA Pre-anesthesia Checklist: Patient identified, Emergency Drugs available, Suction available, Patient being monitored and Timeout performed Patient Re-evaluated:Patient Re-evaluated prior to induction Oxygen Delivery Method: Circle system utilized Preoxygenation: Pre-oxygenation with 100% oxygen Induction Type: IV induction Ventilation: Mask ventilation without difficulty Laryngoscope Size: Mac and 4 Grade View: Grade I Tube type: Oral Tube size: 7.5 mm Number of attempts: 1 Airway Equipment and Method: Stylet Placement Confirmation: ETT inserted through vocal cords under direct vision,  positive ETCO2,  CO2 detector and breath sounds checked- equal and bilateral Secured at: 22 cm Tube secured with: Tape Dental Injury: Teeth and Oropharynx as per pre-operative assessment

## 2018-10-25 NOTE — Op Note (Signed)
Preoperative diagnosis: supraumbilical hernia and diastasis recti Postoperative diagnosis: same as above Procedure Laparoscopic umbilical hernia repair with 15x20 cm Ventralight ST mesh Surgeon: Dr Serita Grammes Anesthesia: general with bilateral TAP block EBL: minimal Specimens none Drains none Complications none dispo to recovery stable Sponge and needle count correct times two  Indications: This is a 57 yom with a symptomatic ventral hernia who we discussed a laparoscopic ventral hernia repair with mesh. We discussed all options and elected to proceed with a lap repair.  Procedure: After informed consent obtained, he first underwent TAP blocks bilaterally. He was given antibiotics and had SCDs in place. He was placed under general anesthesia without complication. He was then prepped and draped in standard sterile surgical fashion.  Charlie Pitter was placed over his abdomen.  A surgical timeout was performed.  I infiltrated marcaine in the left upper quadrant. I then made an incision and inserted a 5 mm optiview trocar.  This was done without injury.  I insufflated the abdomen to 15 mmHg pressure.  I then placed an additional 10 mm trocar in the left mid abdomen and another 5 mm trocar in the left lower quadrant.  I also placed 2 5 mm trocars in the right abdomen.   I reduced this using atraumatic graspers. I excised the hernia sac and its contents.  The hernia was about 2 cm in size but there was significant weakness there and a small umbilical hernia. I elected to cover all of this.   I then elected to use a 15x20 cm ventralight ST mesh.  I placed 4 0 Vicryl sutures in the cardinal positions around the mesh.  I then inserted the mesh through the 10 mm trocar.  I laid this flat.  I centered the middle portion of the mesh to the hernia.  This gave at least 5 cm overlap in all directions.  I then used the suture passer to bring the sutures up to bring the mesh flat against the abdominal wall.  I tied  these down.  I then used the secure strap tacker to secure the edges did not leave any flaps.  The mesh was in good position upon completion.  There was no evidence of any injury to any surrounding structures.  I then used a 0 Vicryl in the suture passer to close my 10 mm defect.  The abdomen was then desufflated and the remainder of the trocars removed.   I then closed all the incisions with 4-0 Monocryl and glue.  Steri-Strips were applied.  He tolerated this well was extubated and transferred to recovery stable.

## 2018-10-25 NOTE — Anesthesia Postprocedure Evaluation (Signed)
Anesthesia Post Note  Patient: Kenyatte C Ponds  Procedure(s) Performed: LAPAROSCOPIC VENTRAL HERNIA REPAIR WITH MESH (N/A ) INSERTION OF VENTRALIGHT ST 6IN X 8IN MESH (N/A Abdomen)     Patient location during evaluation: PACU Anesthesia Type: General Level of consciousness: awake and alert Pain management: pain level controlled Vital Signs Assessment: post-procedure vital signs reviewed and stable Respiratory status: spontaneous breathing, nonlabored ventilation and respiratory function stable Cardiovascular status: blood pressure returned to baseline and stable Postop Assessment: no apparent nausea or vomiting Anesthetic complications: no    Last Vitals:  Vitals:   10/25/18 1455 10/25/18 1524  BP: 113/71 106/75  Pulse: 65 (!) 55  Resp: 13 15  Temp: 36.5 C 36.5 C  SpO2: 96% 100%    Last Pain:  Vitals:   10/25/18 1524  TempSrc: Oral  PainSc: 3                  Audry Pili

## 2018-10-25 NOTE — Progress Notes (Signed)
Pt new admit from PACU s/p abdominal supra umbilical hernia repair and diastasis recti with 9 lap sites skin glued and steri-strips, alert and oriented with left foot post surgery with ace wrapped warm to touch, no complain of pain at this time, he is due to void.

## 2018-10-25 NOTE — Anesthesia Preprocedure Evaluation (Addendum)
Anesthesia Evaluation  Patient identified by MRN, date of birth, ID band Patient awake    Reviewed: Allergy & Precautions, NPO status , Patient's Chart, lab work & pertinent test results  History of Anesthesia Complications Negative for: history of anesthetic complications  Airway Mallampati: I  TM Distance: >3 FB Neck ROM: Full    Dental  (+) Dental Advisory Given, Teeth Intact   Pulmonary neg pulmonary ROS,    breath sounds clear to auscultation       Cardiovascular hypertension, Pt. on medications (-) angina Rhythm:Regular Rate:Normal     Neuro/Psych PSYCHIATRIC DISORDERS Anxiety negative neurological ROS     GI/Hepatic negative GI ROS, Neg liver ROS,   Endo/Other   Obesity   Renal/GU Renal InsufficiencyRenal disease Kidney stones      Musculoskeletal negative musculoskeletal ROS (+)   Abdominal   Peds  Hematology negative hematology ROS (+)   Anesthesia Other Findings   Reproductive/Obstetrics                            Anesthesia Physical Anesthesia Plan  ASA: II  Anesthesia Plan: General   Post-op Pain Management:  Regional for Post-op pain   Induction: Intravenous  PONV Risk Score and Plan: 3 and Treatment may vary due to age or medical condition, Ondansetron, Dexamethasone and Midazolam  Airway Management Planned: Oral ETT  Additional Equipment: None  Intra-op Plan:   Post-operative Plan: Extubation in OR  Informed Consent: I have reviewed the patients History and Physical, chart, labs and discussed the procedure including the risks, benefits and alternatives for the proposed anesthesia with the patient or authorized representative who has indicated his/her understanding and acceptance.   Dental advisory given  Plan Discussed with: CRNA and Anesthesiologist  Anesthesia Plan Comments:        Anesthesia Quick Evaluation

## 2018-10-25 NOTE — Transfer of Care (Signed)
Immediate Anesthesia Transfer of Care Note  Patient: Jeremy Gray  Procedure(s) Performed: LAPAROSCOPIC VENTRAL HERNIA REPAIR WITH MESH (N/A ) INSERTION OF VENTRALIGHT ST 6IN X 8IN MESH (N/A Abdomen)  Patient Location: PACU  Anesthesia Type:General  Level of Consciousness: awake, alert  and oriented  Airway & Oxygen Therapy: Patient Spontanous Breathing and Patient connected to nasal cannula oxygen  Post-op Assessment: Report given to RN and Post -op Vital signs reviewed and stable  Post vital signs: Reviewed and stable  Last Vitals:  Vitals Value Taken Time  BP 105/67 10/25/2018  2:25 PM  Temp 36.5 C 10/25/2018  2:25 PM  Pulse 61 10/25/2018  2:25 PM  Resp 16 10/25/2018  2:25 PM  SpO2 96 % 10/25/2018  2:25 PM    Last Pain:  Vitals:   10/25/18 1059  TempSrc:   PainSc: 0-No pain      Patients Stated Pain Goal: 2 (79/89/21 1941)  Complications: No apparent anesthesia complications

## 2018-10-25 NOTE — Anesthesia Procedure Notes (Signed)
Anesthesia Regional Block: TAP block   Pre-Anesthetic Checklist: ,, timeout performed, Correct Patient, Correct Site, Correct Laterality, Correct Procedure, Correct Position, site marked, Risks and benefits discussed,  Surgical consent,  Pre-op evaluation,  At surgeon's request and post-op pain management  Laterality: Left  Prep: chloraprep       Needles:  Injection technique: Single-shot  Needle Type: Echogenic Needle     Needle Length: 9cm  Needle Gauge: 21     Additional Needles:   Narrative:  Start time: 10/25/2018 11:59 AM End time: 10/25/2018 12:02 PM Injection made incrementally with aspirations every 5 mL.  Performed by: Personally  Anesthesiologist: Audry Pili, MD  Additional Notes: No pain on injection. No increased resistance to injection. Injection made in 5cc increments. Good needle visualization. Patient tolerated the procedure well.

## 2018-10-25 NOTE — Discharge Instructions (Signed)
[facility] ° °UMBILICAL OR INGUINAL HERNIA REPAIR: POST OP INSTRUCTIONS ° °Always review your discharge instruction sheet given to you by the facility where your surgery was performed. °IF YOU HAVE DISABILITY OR FAMILY LEAVE FORMS, YOU MUST BRING THEM TO THE OFFICE FOR PROCESSING.   °DO NOT GIVE THEM TO YOUR DOCTOR. ° °1. A  prescription for pain medication may be given to you upon discharge.  Take your pain medication as prescribed, if needed.  If narcotic pain medicine is not needed, then you may take acetaminophen (Tylenol), naprosyn (Alleve) or ibuprofen (Advil) as needed. °2. Take your usually prescribed medications unless otherwise directed. °3. If you need a refill on your pain medication, please contact your pharmacy.  They will contact our office to request authorization. Prescriptions will not be filled after 5 pm or on week-ends. °4. You should follow a light diet the first 24 hours after arrival home, such as soup and crackers, etc.  Be sure to include lots of fluids daily.  Resume your normal diet the day after surgery. °5. Most patients will experience some swelling and bruising around the umbilicus or in the groin and scrotum.  Ice packs and reclining will help.  Swelling and bruising can take several days to resolve.  °6. It is common to experience some constipation if taking pain medication after surgery.  Increasing fluid intake and taking a stool softener (such as Colace) will usually help or prevent this problem from occurring.  A mild laxative (Milk of Magnesia or Miralax) should be taken according to package directions if there are no bowel movements after 48 hours. °7. Unless discharge instructions indicate otherwise, you may remove your bandages 48 hours after surgery, and you may shower at that time.  You may have steri-strips (small skin tapes) in place directly over the incision.  These strips should be left on the skin for 7-10 days and will come off on their own.   If your surgeon used skin glue on the incision, you may shower in 24 hours.  The glue will flake off over the next 2-3 weeks.  Any sutures or staples will be removed at the office during your follow-up visit. °8. ACTIVITIES:  You may resume regular (light) daily activities beginning the next day--such as daily self-care, walking, climbing stairs--gradually increasing activities as tolerated.  You may have sexual intercourse when it is comfortable.  Refrain from any heavy lifting or straining until approved by your doctor. °a. You may drive when you are no longer taking prescription pain medication, you can comfortably wear a seatbelt, and you can safely maneuver your car and apply brakes. °b. RETURN TO WORK:  __________________________________________________________ °9. You should see your doctor in the office for a follow-up appointment approximately 2-3 weeks after your surgery.  Make sure that you call for this appointment within a day or two after you arrive home to insure a convenient appointment time. °10. OTHER INSTRUCTIONS:  __________________________________________________________________________________________________________________________________________________________________________________________  °WHEN TO CALL YOUR DOCTOR: °1. Fever over 101.0 °2. Inability to urinate °3. Nausea and/or vomiting °4. Extreme swelling or bruising °5. Continued bleeding from incision. °6. Increased pain, redness, or drainage from the incision ° °The clinic staff is available to answer your questions during regular business hours.  Please don’t hesitate to call and ask to speak to one of the nurses for clinical concerns.  If you have a medical emergency, go to the nearest emergency room or call 911.  A surgeon from [facility]   is always on call at the hospital ° ° °[address], [address], [place], [place]  [address] ? ° [address] [address], , [place]   [address] °[phone] ? [phone] ? FAX  [phone] °Web site: [email or web address] ° ° °

## 2018-10-26 DIAGNOSIS — K429 Umbilical hernia without obstruction or gangrene: Secondary | ICD-10-CM | POA: Diagnosis not present

## 2018-10-26 LAB — CBC
HCT: 38.9 % — ABNORMAL LOW (ref 39.0–52.0)
Hemoglobin: 12.6 g/dL — ABNORMAL LOW (ref 13.0–17.0)
MCH: 25.8 pg — AB (ref 26.0–34.0)
MCHC: 32.4 g/dL (ref 30.0–36.0)
MCV: 79.6 fL — AB (ref 80.0–100.0)
Platelets: 301 10*3/uL (ref 150–400)
RBC: 4.89 MIL/uL (ref 4.22–5.81)
RDW: 12.3 % (ref 11.5–15.5)
WBC: 11.9 10*3/uL — ABNORMAL HIGH (ref 4.0–10.5)
nRBC: 0 % (ref 0.0–0.2)

## 2018-10-26 LAB — BASIC METABOLIC PANEL
Anion gap: 10 (ref 5–15)
BUN: 15 mg/dL (ref 6–20)
CO2: 24 mmol/L (ref 22–32)
Calcium: 8.6 mg/dL — ABNORMAL LOW (ref 8.9–10.3)
Chloride: 105 mmol/L (ref 98–111)
Creatinine, Ser: 1.62 mg/dL — ABNORMAL HIGH (ref 0.61–1.24)
GFR calc Af Amer: 56 mL/min — ABNORMAL LOW (ref >60)
GFR calc non Af Amer: 48 mL/min — ABNORMAL LOW (ref >60)
Glucose, Bld: 109 mg/dL — ABNORMAL HIGH (ref 70–99)
Potassium: 4.1 mmol/L (ref 3.5–5.1)
Sodium: 139 mmol/L (ref 135–145)

## 2018-10-26 MED ORDER — ACETAMINOPHEN 500 MG PO TABS: 1000.0000 mg | ORAL_TABLET | Freq: Four times a day (QID) | ORAL | 0 refills | Status: DC | PRN

## 2018-10-26 NOTE — Progress Notes (Signed)
Pt for discharged discontinued peripheral IV line, given health teachings, next appt, due med explained and understood, given all his personal belongings, no complain of pain at this time, wound site no bleeding noted skin intact.

## 2018-10-26 NOTE — Evaluation (Signed)
Physical Therapy Evaluation/ Discharge Patient Details Name: Jeremy Gray MRN: [id number] DOB: 1966/03/15 Today's Date: 10/26/2018   History of Present Illness  52 yo admitted for laproscopic ventral hernia repair. PMx: HTN, kidney stone, left foot granuloma resection  Clinical Impression  Pt very pleasant and fiancee present during session. 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 Pt with good mobility and balance with all education for transfers and gait provided with questions answered and pt verbalizes understanding. Pt educated for normalization of gait once cleared by podiatry. No further needs will sign off, pt aware and agreeable.     Follow Up Recommendations No PT follow up    Equipment Recommendations  None recommended by PT    Recommendations for Other Services       Precautions / Restrictions Precautions Precautions: None Required Braces or Orthoses: Other Brace Other Brace: CAM boot LLE Restrictions LLE Weight Bearing: Weight bearing as tolerated      Mobility  Bed Mobility Overal bed mobility: Modified Independent             General bed mobility comments: cues for rolling and side to sit  Transfers Overall transfer level: Modified independent                  Ambulation/Gait Ambulation/Gait assistance: Independent Gait Distance (Feet): 300 Feet Assistive device: None Gait Pattern/deviations: Antalgic   Gait velocity interpretation: >2.62 ft/sec, indicative of community ambulatory General Gait Details: decreased stance on LLE due to CAM boot, varied stance due to shoe height difference, no LOB  Stairs Stairs: Yes Stairs assistance: Modified independent (Device/Increase time) Stair Management: Alternating pattern;Step to pattern;Forwards;One rail Left Number of Stairs: 11 General stair comments: ascending alternating, descending step to  Wheelchair  Mobility    Modified Rankin (Stroke Patients Only)       Balance Overall balance assessment: No apparent balance deficits (not formally assessed)                                           Pertinent Vitals/Pain Pain Assessment: 0-10 Pain Score: 2  Pain Location: abdomen Pain Descriptors / Indicators: Sore Pain Intervention(s): Limited activity within patient's tolerance    Home Living Family/patient expects to be discharged to:: Private residence Living Arrangements: Spouse/significant other Available Help at Discharge: Family;Available 24 hours/day Type of Home: House Home Access: Stairs to enter   [employer] of Steps: 4 Home Layout: Two level;Bed/bath upstairs Home Equipment: Other (comment)(loftstrand crutches)      Prior Function Level of Independence: Independent               Hand Dominance        Extremity/Trunk Assessment   Upper Extremity Assessment Upper Extremity Assessment: Overall WFL for tasks assessed    Lower Extremity Assessment Lower Extremity Assessment: Overall WFL for tasks assessed    Cervical / Trunk Assessment Cervical / Trunk Assessment: Normal;Other exceptions Cervical / Trunk Exceptions: guarded movement of abdomen  Communication   Communication: No difficulties  Cognition Arousal/Alertness: Awake/alert Behavior During Therapy: WFL for tasks assessed/performed Overall Cognitive Status: Within Functional Limits for tasks assessed  General Comments      Exercises     Assessment/Plan    PT Assessment Patent does not need any further PT services  PT Problem List         PT Treatment Interventions      PT Goals (Current goals can be found in the Care Plan section)  Acute Rehab PT Goals PT Goal Formulation: All assessment and education complete, DC therapy    Frequency     Barriers to discharge        Co-evaluation                AM-PAC PT "6 Clicks" Mobility  Outcome Measure Help needed turning from your back to your side while in a flat bed without using bedrails?: None Help needed moving from lying on your back to sitting on the side of a flat bed without using bedrails?: None Help needed moving to and from a bed to a chair (including a wheelchair)?: None Help needed standing up from a chair using your arms (e.g., wheelchair or bedside chair)?: None Help needed to walk in hospital room?: None Help needed climbing 3-5 steps with a railing? : None 6 Click Score: 24    End of Session Equipment Utilized During Treatment: Other (comment)(CAM boot left) Activity Tolerance: Patient tolerated treatment well Patient left: in chair;with call bell/phone within reach;with family/visitor present Nurse Communication: Mobility status PT Visit Diagnosis: Other abnormalities of gait and mobility (R26.89)    Time: 3382-5053 PT Time Calculation (min) (ACUTE ONLY): 24 min   Charges:   PT Evaluation $PT Eval Moderate Complexity: 1 Mod          [place] Pager: [phone] Office: [phone]   Analys Ryden B Arelene Moroni 10/26/2018, 9:06 AM

## 2018-10-26 NOTE — Progress Notes (Signed)
Pt discharge accompanied by his fiancee assisted by Nurse tech via wheelchair.

## 2018-10-26 NOTE — Discharge Summary (Signed)
  [facility] Discharge Summary   Patient ID: Jeremy Gray MRN: [id number] DOB/AGE: 05/14/1966 52 y.o.  Admit date: 10/25/2018 Discharge date: 10/26/2018  Admitting Diagnosis: supraumbilical hernia and diastasis recti  Discharge Diagnosis Patient Active Problem List   Diagnosis Date Noted  . Ventral hernia 10/25/2018  . Encounter for general adult medical examination without abnormal findings 10/26/2011  . Anxiety 09/14/2011    Consultants None  Imaging: No results found.  Procedures Dr. Donne Hazel (10/25/18) - Laparoscopic umbilical hernia repair with 15x20 cm Ventralight ST mesh  Hospital Course:  Jeremy Gray is a 52 y.o. male with h/o supraumbilical hernia and diastasis recti who was admitted to [facility] 12/20 for the above listed elective procedure.  Tolerated procedure well and was transferred to the floor.  Diet was advanced as tolerated.  Patient worked with therapies during this admission due to recent foot surgery. On POD1 the patient was voiding well, tolerating diet, ambulating well, pain well controlled, vital signs stable, incisions c/d/i and felt stable for discharge home.  Patient will follow up as below and knows to call with questions or concerns.    Physical Exam: General:  Alert, NAD, pleasant, comfortable Pulm: effort normal Cardio: RRR Abd:  Soft, ND, appropriately tender, multiple lap incisions C/D/I, +BS  Allergies as of 10/26/2018   No Known Allergies     Medication List    STOP taking these medications   HYDROcodone-acetaminophen 10-325 MG tablet Commonly known as:  NORCO     TAKE these medications   acetaminophen 500 MG tablet Commonly known as:  TYLENOL Take 2 tablets (1,000 mg total) by mouth every 6 (six) hours as needed.   amLODipine 5 MG tablet Commonly known as:  NORVASC Take 5 mg by mouth daily.   docusate sodium 100 MG capsule Commonly known as:  COLACE Take 1 capsule (100 mg total) by mouth 2 (two) times  daily.   ibuprofen 800 MG tablet Commonly known as:  ADVIL,MOTRIN Take 1 tablet (800 mg total) by mouth every 8 (eight) hours as needed. What changed:  reasons to take this   losartan 50 MG tablet Commonly known as:  COZAAR Take 50 mg by mouth daily.   ondansetron 4 MG disintegrating tablet Commonly known as:  ZOFRAN ODT Take 1 tablet (4 mg total) by mouth every 8 (eight) hours as needed for nausea or vomiting.   oxyCODONE 5 MG immediate release tablet Commonly known as:  Oxy IR/ROXICODONE Take 1 tablet (5 mg total) by mouth every 6 (six) hours as needed for moderate pain, severe pain or breakthrough pain.   promethazine 25 MG tablet Commonly known as:  PHENERGAN Take 1 tablet (25 mg total) by mouth every 8 (eight) hours as needed for nausea or vomiting.   tamsulosin 0.4 MG Caps capsule Commonly known as:  FLOMAX Take 1 capsule (0.4 mg total) by mouth daily for 14 days.        Follow-up Information    Rolm Bookbinder, MD In 3 weeks.   Specialty:  General Surgery Contact information: [place] [id number] [phone]           Signed: Wellington Hampshire, [facility] 10/26/2018, 8:35 AM Pager: [phone] Mon 7:00 am -11:30 AM Tues-Fri 7:00 am-4:30 pm Sat-Sun 7:00 am-11:30 am

## 2018-10-28 ENCOUNTER — Encounter (HOSPITAL_COMMUNITY): Payer: Self-pay | Admitting: General Surgery

## 2018-10-29 ENCOUNTER — Telehealth: Payer: Self-pay | Admitting: Surgery

## 2018-10-29 NOTE — Telephone Encounter (Signed)
I spoke to his fiancee, Glenard Haring. He had a lap umbilical hernia repair on 10/25/2018 by Dr. Donne Hazel.   He has had significant pain since discharge.  It sounds like he is taking all the right meds - narcotics and NSAIDs.  I suggested using an ice pack.  They'll check with our office if he is not better in the next few days.  Alphonsa Overall, MD, [facility] Pager: [phone] Office phone:  [phone]

## 2018-11-05 ENCOUNTER — Ambulatory Visit (INDEPENDENT_AMBULATORY_CARE_PROVIDER_SITE_OTHER): Payer: 59 | Admitting: Podiatry

## 2018-11-05 ENCOUNTER — Encounter: Payer: Self-pay | Admitting: Podiatry

## 2018-11-05 DIAGNOSIS — M60272 Foreign body granuloma of soft tissue, not elsewhere classified, left ankle and foot: Secondary | ICD-10-CM

## 2018-11-05 DIAGNOSIS — Z09 Encounter for follow-up examination after completed treatment for conditions other than malignant neoplasm: Secondary | ICD-10-CM

## 2018-11-05 NOTE — Progress Notes (Signed)
This patient presents the office for an evaluation of his left foot following surgery by Dr. Cannon Kettle.  Patient had a granuloma removed from the left heel 4 weeks ago.  He states he has been walking with his cam walker.  He states he is having decreased pain swelling with no drainage from the surgical site.  He presents the office today for a postoperative visit.  Objective neurovascular status intact no pain noted in the left calf healing noted at the surgical site with good wound coaptation.  There is healing callus noted at the incision site in the absence of drainage.  No evidence of any redness swelling or pain noted at the surgical site  S/P foot surgery left  ROV.  Patient has continued to heal nicely following her surgery.  Patient was told to continue to knock with the cam walker and to return to the office in 2 weeks for Dr. Cannon Kettle to evaluate and possibly discharge.  Patient to call the office any problems occur   Gardiner Barefoot DPM

## 2018-11-19 ENCOUNTER — Encounter: Payer: Self-pay | Admitting: Sports Medicine

## 2018-11-19 ENCOUNTER — Ambulatory Visit (INDEPENDENT_AMBULATORY_CARE_PROVIDER_SITE_OTHER): Payer: 59 | Admitting: Sports Medicine

## 2018-11-19 DIAGNOSIS — Z09 Encounter for follow-up examination after completed treatment for conditions other than malignant neoplasm: Secondary | ICD-10-CM

## 2018-11-19 DIAGNOSIS — M79672 Pain in left foot: Secondary | ICD-10-CM

## 2018-11-19 DIAGNOSIS — M60272 Foreign body granuloma of soft tissue, not elsewhere classified, left ankle and foot: Secondary | ICD-10-CM

## 2018-11-19 NOTE — Progress Notes (Signed)
Subjective: Jeremy Gray is a 53 y.o. male patient seen today in office for POV #4 (DOS 10-07-18), S/P excision of mass left foot. Patient denies pain at surgical site or problems.  Patient denies calf pain, denies headache, chest pain, shortness of breath, nausea, vomiting, fever, or chills. No other issues noted.   Patient Active Problem List   Diagnosis Date Noted  . Ventral hernia 10/25/2018  . Encounter for general adult medical examination without abnormal findings 10/26/2011  . Anxiety 09/14/2011    Current Outpatient Medications on File Prior to Visit  Medication Sig Dispense Refill  . acetaminophen (TYLENOL) 500 MG tablet Take 2 tablets (1,000 mg total) by mouth every 6 (six) hours as needed. 30 tablet 0  . amLODipine (NORVASC) 5 MG tablet Take 5 mg by mouth daily.  1  . docusate sodium (COLACE) 100 MG capsule Take 1 capsule (100 mg total) by mouth 2 (two) times daily. (Patient not taking: Reported on 10/14/2018) 10 capsule 0  . ibuprofen (ADVIL,MOTRIN) 800 MG tablet Take 1 tablet (800 mg total) by mouth every 8 (eight) hours as needed. (Patient taking differently: Take 800 mg by mouth every 8 (eight) hours as needed for headache or moderate pain. ) 30 tablet 0  . losartan (COZAAR) 50 MG tablet Take 50 mg by mouth daily.  1  . ondansetron (ZOFRAN ODT) 4 MG disintegrating tablet Take 1 tablet (4 mg total) by mouth every 8 (eight) hours as needed for nausea or vomiting. 12 tablet 0  . oxyCODONE (OXY IR/ROXICODONE) 5 MG immediate release tablet Take 1 tablet (5 mg total) by mouth every 6 (six) hours as needed for moderate pain, severe pain or breakthrough pain. 10 tablet 0  . promethazine (PHENERGAN) 25 MG tablet Take 1 tablet (25 mg total) by mouth every 8 (eight) hours as needed for nausea or vomiting. (Patient not taking: Reported on 10/14/2018) 20 tablet 0   No current facility-administered medications on file prior to visit.     No Known Allergies  Objective: There were no  vitals filed for this visit.  General: No acute distress, AAOx3  Left foot:Incision well healed at surgical site, mild swelling to left foot, no erythema, no warmth, no drainage, no signs of infection noted, Capillary fill time <3 seconds in all digits, gross sensation present via light touch to left foot. No pain or crepitation with range of motion left foot.  No pain with calf compression.   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 Landis Martins, DPM

## 2018-12-17 ENCOUNTER — Encounter: Payer: Self-pay | Admitting: Sports Medicine

## 2018-12-17 ENCOUNTER — Ambulatory Visit (INDEPENDENT_AMBULATORY_CARE_PROVIDER_SITE_OTHER): Payer: 59 | Admitting: Sports Medicine

## 2018-12-17 DIAGNOSIS — Z09 Encounter for follow-up examination after completed treatment for conditions other than malignant neoplasm: Secondary | ICD-10-CM

## 2018-12-17 DIAGNOSIS — M60272 Foreign body granuloma of soft tissue, not elsewhere classified, left ankle and foot: Secondary | ICD-10-CM

## 2018-12-17 DIAGNOSIS — M79672 Pain in left foot: Secondary | ICD-10-CM

## 2018-12-17 NOTE — Progress Notes (Signed)
Subjective: Jeremy Gray is a 53 y.o. male patient seen today in office for POV #5 (DOS 10-07-18), S/P excision of mass left foot. Patient denies pain at surgical site or problems reports that sometimes he get mild discomfort but doing good.  Patient denies calf pain, denies headache, chest pain, shortness of breath, nausea, vomiting, fever, or chills. No other issues noted.   Patient Active Problem List   Diagnosis Date Noted  . Ventral hernia 10/25/2018  . Encounter for general adult medical examination without abnormal findings 10/26/2011  . Anxiety 09/14/2011    Current Outpatient Medications on File Prior to Visit  Medication Sig Dispense Refill  . acetaminophen (TYLENOL) 500 MG tablet Take 2 tablets (1,000 mg total) by mouth every 6 (six) hours as needed. 30 tablet 0  . amLODipine (NORVASC) 5 MG tablet Take 5 mg by mouth daily.  1  . docusate sodium (COLACE) 100 MG capsule Take 1 capsule (100 mg total) by mouth 2 (two) times daily. 10 capsule 0  . ibuprofen (ADVIL,MOTRIN) 800 MG tablet Take 1 tablet (800 mg total) by mouth every 8 (eight) hours as needed. (Patient taking differently: Take 800 mg by mouth every 8 (eight) hours as needed for headache or moderate pain. ) 30 tablet 0  . losartan (COZAAR) 50 MG tablet Take 50 mg by mouth daily.  1  . ondansetron (ZOFRAN ODT) 4 MG disintegrating tablet Take 1 tablet (4 mg total) by mouth every 8 (eight) hours as needed for nausea or vomiting. 12 tablet 0  . oxyCODONE (OXY IR/ROXICODONE) 5 MG immediate release tablet Take 1 tablet (5 mg total) by mouth every 6 (six) hours as needed for moderate pain, severe pain or breakthrough pain. 10 tablet 0  . promethazine (PHENERGAN) 25 MG tablet Take 1 tablet (25 mg total) by mouth every 8 (eight) hours as needed for nausea or vomiting. 20 tablet 0   No current facility-administered medications on file prior to visit.     No Known Allergies  Objective: There were no vitals filed for this  visit.  General: No acute distress, AAOx3  Left foot:Incision well healed at surgical site, mild swelling to left foot, no erythema, no warmth, no drainage, no signs of infection noted, Capillary fill time <3 seconds in all digits, gross sensation present via light touch to left foot. No pain or crepitation with range of motion left foot.  No pain with calf compression.   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    -  Primary   Surgery follow-up       Arch pain of left foot           -Patient seen and evaluated -Continue with normal shoes and activities -Encourage scar cream/vasaline as needed  -Advised patient to ice and elevate as necessary  -Work no restrictions  -Return PRN  Landis Martins, DPM

## 2019-09-26 ENCOUNTER — Ambulatory Visit (INDEPENDENT_AMBULATORY_CARE_PROVIDER_SITE_OTHER): Payer: 59 | Admitting: Cardiology

## 2019-09-26 ENCOUNTER — Other Ambulatory Visit: Payer: Self-pay

## 2019-09-26 ENCOUNTER — Other Ambulatory Visit (HOSPITAL_COMMUNITY): Payer: Self-pay | Admitting: Cardiology

## 2019-09-26 ENCOUNTER — Encounter: Payer: Self-pay | Admitting: Cardiology

## 2019-09-26 VITALS — BP 133/89 | HR 72 | Temp 97.9°F | Ht 70.0 in | Wt 258.0 lb

## 2019-09-26 DIAGNOSIS — R072 Precordial pain: Secondary | ICD-10-CM | POA: Diagnosis not present

## 2019-09-26 DIAGNOSIS — I129 Hypertensive chronic kidney disease with stage 1 through stage 4 chronic kidney disease, or unspecified chronic kidney disease: Secondary | ICD-10-CM | POA: Diagnosis not present

## 2019-09-26 DIAGNOSIS — N183 Chronic kidney disease, stage 3 unspecified: Secondary | ICD-10-CM

## 2019-09-26 DIAGNOSIS — R079 Chest pain, unspecified: Secondary | ICD-10-CM | POA: Insufficient documentation

## 2019-09-26 DIAGNOSIS — I10 Essential (primary) hypertension: Secondary | ICD-10-CM | POA: Insufficient documentation

## 2019-09-26 NOTE — Progress Notes (Signed)
Patient referred by Carol Ada, MD for chest/arm pain  Subjective:   Jeremy Gray, male    DOB: [birth year], 53 y.o.   MRN: [id number]   Chief Complaint  Patient presents with  . Hypertension  . Chest Pain    left arm pain  . New Patient (Initial Visit)     HPI  53 y.o. African American male with hypertension, CKD 3, with chest/arm pain.  2 days ago, patient had a retrosternal burning sensation, at rest, but antibiotics on.  However, he does endorse chest tightness on more than usual exertion, associated with shortness of breath.  Patient is a Airline pilot.  He is trying to increase his exercise routine and lose some weight.  He has had hypertension for several years.  He is unaware of any chronic kidney disease.  He has history of kidney stones.  He smokes occasional cigar, but is not a daily smoker.  Past Medical History:  Diagnosis Date  . History of kidney stones   . Hypertension   . Kidney stone      Past Surgical History:  Procedure Laterality Date  . FOOT SURGERY Left   . INSERTION OF MESH N/A 10/25/2018   Procedure: INSERTION OF VENTRALIGHT ST Ralph Leyden MESH;  Surgeon: Rolm Bookbinder, MD;  Location: [facility];  Service: General;  Laterality: N/A;  . VENTRAL HERNIA REPAIR N/A 10/25/2018   Procedure: LAPAROSCOPIC VENTRAL HERNIA REPAIR WITH MESH;  Surgeon: Rolm Bookbinder, MD;  Location: [place];  Service: General;  Laterality: N/A;  GENERAL AND BILATERAL TAP BLOCK     Social History   Socioeconomic History  . Marital status: Married    Spouse name: Not on file  . Number of children: 1  . Years of education: Not on file  . Highest education level: Not on file  Occupational History  . Not on file  Social Needs  . Financial resource strain: Not on file  . Food insecurity    Worry: Not on file    Inability: Not on file  . Transportation needs    Medical: Not on file    Non-medical: Not on file  Tobacco Use  . Smoking status: Light Tobacco  Smoker    Types: Cigars  . Smokeless tobacco: Never Used  . Tobacco comment: once month  Substance and Sexual Activity  . Alcohol use: Yes    Comment: occasionally   . Drug use: No  . Sexual activity: Not on file  Lifestyle  . Physical activity    Days per week: Not on file    Minutes per session: Not on file  . Stress: Not on file  Relationships  . Social Herbalist on phone: Not on file    Gets together: Not on file    Attends religious service: Not on file    Active member of club or organization: Not on file    Attends meetings of clubs or organizations: Not on file    Relationship status: Not on file  . Intimate partner violence    Fear of current or ex partner: Not on file    Emotionally abused: Not on file    Physically abused: Not on file    Forced sexual activity: Not on file  Other Topics Concern  . Not on file  Social History Narrative  . Not on file     Family History  Problem Relation Age of Onset  . Hypertension Father  Current Outpatient Medications on File Prior to Visit  Medication Sig Dispense Refill  . acetaminophen (TYLENOL) 500 MG tablet Take 2 tablets (1,000 mg total) by mouth every 6 (six) hours as needed. 30 tablet 0  . amLODipine (NORVASC) 5 MG tablet Take 5 mg by mouth daily.  1  . docusate sodium (COLACE) 100 MG capsule Take 1 capsule (100 mg total) by mouth 2 (two) times daily. 10 capsule 0  . ibuprofen (ADVIL,MOTRIN) 800 MG tablet Take 1 tablet (800 mg total) by mouth every 8 (eight) hours as needed. (Patient taking differently: Take 800 mg by mouth every 8 (eight) hours as needed for headache or moderate pain. ) 30 tablet 0  . losartan (COZAAR) 50 MG tablet Take 50 mg by mouth daily.  1  . ondansetron (ZOFRAN ODT) 4 MG disintegrating tablet Take 1 tablet (4 mg total) by mouth every 8 (eight) hours as needed for nausea or vomiting. 12 tablet 0  . oxyCODONE (OXY IR/ROXICODONE) 5 MG immediate release tablet Take 1 tablet (5 mg  total) by mouth every 6 (six) hours as needed for moderate pain, severe pain or breakthrough pain. 10 tablet 0  . promethazine (PHENERGAN) 25 MG tablet Take 1 tablet (25 mg total) by mouth every 8 (eight) hours as needed for nausea or vomiting. 20 tablet 0   No current facility-administered medications on file prior to visit.     Cardiovascular studies:  EKG 09/26/2019: Sinus rhythm 74 bpm. Normal EKG.   Recent labs: Results for HY, SCHUFF (MRN [id number]) as of 09/26/2019 12:14  Ref. Range 10/26/2018 04:11  Sodium Latest Ref Range: 135 - 145 mmol/L 139  Potassium Latest Ref Range: 3.5 - 5.1 mmol/L 4.1  Chloride Latest Ref Range: 98 - 111 mmol/L 105  CO2 Latest Ref Range: 22 - 32 mmol/L 24  Glucose Latest Ref Range: 70 - 99 mg/dL 109 (H)  BUN Latest Ref Range: 6 - 20 mg/dL 15  Creatinine Latest Ref Range: 0.61 - 1.24 mg/dL 1.62 (H)  Calcium Latest Ref Range: 8.9 - 10.3 mg/dL 8.6 (L)  Anion gap Latest Ref Range: 5 - 15  10   Results for TIMO, DRUST (MRN [id number]) as of 09/26/2019 12:14  Ref. Range 10/26/2018 04:11  WBC Latest Ref Range: 4.0 - 10.5 K/uL 11.9 (H)  RBC Latest Ref Range: 4.22 - 5.81 MIL/uL 4.89  Hemoglobin Latest Ref Range: 13.0 - 17.0 g/dL 12.6 (L)  HCT Latest Ref Range: 39.0 - 52.0 % 38.9 (L)  MCV Latest Ref Range: 80.0 - 100.0 fL 79.6 (L)  MCH Latest Ref Range: 26.0 - 34.0 pg 25.8 (L)  MCHC Latest Ref Range: 30.0 - 36.0 g/dL 32.4  RDW Latest Ref Range: 11.5 - 15.5 % 12.3  Platelets Latest Ref Range: 150 - 400 K/uL 301  nRBC Latest Ref Range: 0.0 - 0.2 % 0.0    Review of Systems  Constitution: Negative for decreased appetite, malaise/fatigue, weight gain and weight loss.  HENT: Negative for congestion.   Eyes: Negative for visual disturbance.  Cardiovascular: Positive for chest pain and dyspnea on exertion. Negative for leg swelling, palpitations and syncope.  Respiratory: Negative for cough.   Endocrine: Negative for cold intolerance.   Hematologic/Lymphatic: Does not bruise/bleed easily.  Skin: Negative for itching and rash.  Musculoskeletal: Negative for myalgias.  Gastrointestinal: Negative for abdominal pain, nausea and vomiting.  Genitourinary: Negative for dysuria.  Neurological: Negative for dizziness and weakness.  Psychiatric/Behavioral: The patient is not nervous/anxious.   All  other systems reviewed and are negative.        Vitals:   09/26/19 1244  BP: 133/89  Pulse: 72  Temp: 97.9 F (36.6 C)  SpO2: 98%     Body mass index is 37.02 kg/m. Filed Weights   09/26/19 1244  Weight: 258 lb (117 kg)     Objective:   Physical Exam  Constitutional: He is oriented to person, place, and time. He appears well-developed and well-nourished. No distress.  HENT:  Head: Normocephalic and atraumatic.  Eyes: Pupils are equal, round, and reactive to light. Conjunctivae are normal.  Neck: No JVD present.  Cardiovascular: Normal rate, regular rhythm and intact distal pulses.  No murmur heard. Pulmonary/Chest: Effort normal and breath sounds normal. He has no wheezes. He has no rales.  Abdominal: Soft. Bowel sounds are normal. There is no rebound.  Musculoskeletal:        General: No edema.  Lymphadenopathy:    He has no cervical adenopathy.  Neurological: He is alert and oriented to person, place, and time. No cranial nerve deficit.  Skin: Skin is warm and dry.  Psychiatric: He has a normal mood and affect.  Nursing note and vitals reviewed.         Assessment & Recommendations:   53 y.o. African American male with hypertension, CKD 3, with chest/arm pain.  Chest/arm pain: He does have some features of exertional angina.  Given his hypertension as his risk factor, recommend exercise nuclear stress test, along with echocardiogram.  We will also obtain CBC, BMP, lipid panel.  Hypertension: Fairly well controlled on amlodipine 5, losartan 50.  Further recommendations after above tests. Recommend  virtual follow up.  Thank you for referring the patient to [place]. Please feel free to contact with any questions.  Nigel Mormon, MD [facility] Pager: [phone] Office: [phone]

## 2019-09-27 LAB — CBC
Hematocrit: 43 % (ref 37.5–51.0)
Hemoglobin: 14.4 g/dL (ref 13.0–17.7)
MCH: 26.5 pg — ABNORMAL LOW (ref 26.6–33.0)
MCHC: 33.5 g/dL (ref 31.5–35.7)
MCV: 79 fL (ref 79–97)
Platelets: 263 10*3/uL (ref 150–450)
RBC: 5.44 x10E6/uL (ref 4.14–5.80)
RDW: 12.9 % (ref 11.6–15.4)
WBC: 6.9 10*3/uL (ref 3.4–10.8)

## 2019-09-27 LAB — BASIC METABOLIC PANEL
BUN/Creatinine Ratio: 8 — ABNORMAL LOW (ref 9–20)
BUN: 12 mg/dL (ref 6–24)
CO2: 23 mmol/L (ref 20–29)
Calcium: 9.7 mg/dL (ref 8.7–10.2)
Chloride: 105 mmol/L (ref 96–106)
Creatinine, Ser: 1.43 mg/dL — ABNORMAL HIGH (ref 0.76–1.27)
GFR calc Af Amer: 65 mL/min/{1.73_m2} (ref >59)
GFR calc non Af Amer: 56 mL/min/{1.73_m2} — ABNORMAL LOW (ref >59)
Glucose: 94 mg/dL (ref 65–99)
Potassium: 4.3 mmol/L (ref 3.5–5.2)
Sodium: 142 mmol/L (ref 134–144)

## 2019-10-01 ENCOUNTER — Other Ambulatory Visit: Payer: Self-pay

## 2019-10-01 DIAGNOSIS — I10 Essential (primary) hypertension: Secondary | ICD-10-CM

## 2019-10-06 ENCOUNTER — Ambulatory Visit (INDEPENDENT_AMBULATORY_CARE_PROVIDER_SITE_OTHER): Payer: 59

## 2019-10-06 ENCOUNTER — Other Ambulatory Visit: Payer: Self-pay

## 2019-10-06 DIAGNOSIS — R072 Precordial pain: Secondary | ICD-10-CM | POA: Diagnosis not present

## 2019-10-09 ENCOUNTER — Ambulatory Visit (INDEPENDENT_AMBULATORY_CARE_PROVIDER_SITE_OTHER): Payer: 59

## 2019-10-09 ENCOUNTER — Other Ambulatory Visit: Payer: Self-pay

## 2019-10-09 DIAGNOSIS — R072 Precordial pain: Secondary | ICD-10-CM

## 2019-10-09 DIAGNOSIS — I10 Essential (primary) hypertension: Secondary | ICD-10-CM

## 2019-10-09 NOTE — Progress Notes (Signed)
Pt aware.

## 2019-10-12 NOTE — Progress Notes (Signed)
Patient referred by Carol Ada, MD for chest/arm pain  Subjective:   Jeremy Gray, male    DOB: 02/19/1966, 53 y.o.   MRN: [id number]   Chief Complaint  Patient presents with  . Hypertension  . Follow-up  . Results    echo and stress     HPI  53 y.o. African American male with hypertension, CKD 3, with chest/arm pain.  Echocardiogram and stress test results discussed with the patient, details below. He has not had any recurrent chest pain. 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 He is trying to quit smoking cigar. He will be getting his lipid panel checked through work in February 2021.   Past Medical History:  Diagnosis Date  . History of kidney stones   . Hypertension   . Kidney stone      Past Surgical History:  Procedure Laterality Date  . FOOT SURGERY Left   . INSERTION OF MESH N/A 10/25/2018   Procedure: INSERTION OF VENTRALIGHT ST Ralph Leyden MESH;  Surgeon: Rolm Bookbinder, MD;  Location: [place];  Service: General;  Laterality: N/A;  . VENTRAL HERNIA REPAIR N/A 10/25/2018   Procedure: LAPAROSCOPIC VENTRAL HERNIA REPAIR WITH MESH;  Surgeon: Rolm Bookbinder, MD;  Location: [place];  Service: General;  Laterality: N/A;  GENERAL AND BILATERAL TAP BLOCK     Social History   Socioeconomic History  . Marital status: Married    Spouse name: Not on file  . Number of children: 1  . Years of education: Not on file  . Highest education level: Not on file  Occupational History  . Not on file  Social Needs  . Financial resource strain: Not on file  . Food insecurity    Worry: Not on file    Inability: Not on file  . Transportation needs    Medical: Not on file    Non-medical: Not on file  Tobacco Use  . Smoking status: Light Tobacco Smoker    Types: Cigars  . Smokeless tobacco: Never Used  . Tobacco comment: once month  Substance and Sexual Activity  . Alcohol use: Yes    Comment: occasionally   . Drug  use: No  . Sexual activity: Not on file  Lifestyle  . Physical activity    Days per week: Not on file    Minutes per session: Not on file  . Stress: Not on file  Relationships  . Social Herbalist on phone: Not on file    Gets together: Not on file    Attends religious service: Not on file    Active member of club or organization: Not on file    Attends meetings of clubs or organizations: Not on file    Relationship status: Not on file  . Intimate partner violence    Fear of current or ex partner: Not on file    Emotionally abused: Not on file    Physically abused: Not on file    Forced sexual activity: Not on file  Other Topics Concern  . Not on file  Social History Narrative  . Not on file     Family History  Problem Relation Age of Onset  . Hypertension Father      Current Outpatient Medications on File Prior to Visit  Medication Sig Dispense Refill  . amLODipine (NORVASC) 5 MG tablet Take 5 mg by mouth daily.  1  . losartan (COZAAR) 50  MG tablet Take 50 mg by mouth daily.  1   No current facility-administered medications on file prior to visit.     Cardiovascular studies:  Echocardiogram 10/09/2019: Normal LV systolic function with EF 55%. Left ventricle cavity is normal in size. Mild concentric hypertrophy of the left ventricle. Normal global wall motion. Calculated EF 55%. Mild (Grade I) mitral regurgitation. Inadequate TR jet to estimate pulmonary artery systolic pressure. Normal right atrial pressure.   Lexiscan Sestamibi stress test 10/06/2019: No previous exam available for comparison. Lexiscan nuclear stress test performed using 1-day protocol. Myocardial perfusion imaging is normal. Left ventricular ejection fraction is 75% with normal wall motion. Low risk study.  EKG 09/26/2019: Sinus rhythm 74 bpm. Normal EKG.   Recent labs: 09/26/2019: Glucose 94, BUN/Cr 12/1.43. EGFR 65. Na/K 142/4.3.  H/H 14/43. MCV 79. Platelets 263     Review of Systems  Constitution: Negative for decreased appetite, malaise/fatigue, weight gain and weight loss.  HENT: Negative for congestion.   Eyes: Negative for visual disturbance.  Cardiovascular: Negative for chest pain, dyspnea on exertion, leg swelling, palpitations and syncope.  Respiratory: Negative for cough.   Endocrine: Negative for cold intolerance.  Hematologic/Lymphatic: Does not bruise/bleed easily.  Skin: Negative for itching and rash.  Musculoskeletal: Negative for myalgias.  Gastrointestinal: Negative for abdominal pain, nausea and vomiting.  Genitourinary: Negative for dysuria.  Neurological: Negative for dizziness and weakness.  Psychiatric/Behavioral: The patient is not nervous/anxious.   All other systems reviewed and are negative.        No vitals available.   Objective:   Physical Exam   Not performed. Telephone visit.       Assessment & Recommendations:   53 y.o. African American male with hypertension, CKD 3, with chest/arm pain.  Chest/arm pain: Reassuring workup as above. Emphasized importance of tobacco cessation and heart healthy diet and lifestyle. He will follow up with his PCP after lipid panel.  Hypertension: Fairly well controlled on amlodipine 5, losartan 50.  I will see him on as needed basis.   Nigel Mormon, MD [facility] Pager: [phone] Office: [phone]

## 2019-10-15 ENCOUNTER — Telehealth (INDEPENDENT_AMBULATORY_CARE_PROVIDER_SITE_OTHER): Payer: 59 | Admitting: Cardiology

## 2019-10-15 ENCOUNTER — Other Ambulatory Visit: Payer: Self-pay

## 2019-10-15 VITALS — Ht 70.0 in | Wt 250.0 lb

## 2019-10-15 DIAGNOSIS — R072 Precordial pain: Secondary | ICD-10-CM | POA: Diagnosis not present

## 2019-10-15 DIAGNOSIS — I10 Essential (primary) hypertension: Secondary | ICD-10-CM | POA: Diagnosis not present

## 2019-12-26 ENCOUNTER — Ambulatory Visit: Payer: 59 | Attending: Internal Medicine

## 2019-12-26 DIAGNOSIS — Z23 Encounter for immunization: Secondary | ICD-10-CM | POA: Insufficient documentation

## 2019-12-26 NOTE — Progress Notes (Signed)
   Covid-19 Vaccination Clinic  Name:  Jeremy Gray    MRN: [id number] DOB: 16-Oct-1966  12/26/2019  Jeremy Gray was observed post Covid-19 immunization for 15 minutes without incidence. He was provided with Vaccine Information Sheet and instruction to access the V-Safe system.   Jeremy Gray was instructed to call 911 with any severe reactions post vaccine: Marland Kitchen Difficulty breathing  . Swelling of your face and throat  . A fast heartbeat  . A bad rash all over your body  . Dizziness and weakness    Immunizations Administered    Name Date Dose VIS Date Route   Pfizer COVID-19 Vaccine 12/26/2019  1:31 PM 0.3 mL 10/17/2019 Intramuscular   Manufacturer: [place]   Lot: [id number]   [place]: [id number]

## 2020-01-20 ENCOUNTER — Ambulatory Visit: Payer: 59 | Attending: Internal Medicine

## 2020-01-20 DIAGNOSIS — Z23 Encounter for immunization: Secondary | ICD-10-CM

## 2020-01-20 NOTE — Progress Notes (Signed)
   Covid-19 Vaccination Clinic  Name:  Jeremy Gray    MRN: [id number] DOB: June 18, 1966  01/20/2020  Jeremy Gray was observed post Covid-19 immunization for 15 minutes without incident. He was provided with Vaccine Information Sheet and instruction to access the V-Safe system.   Jeremy Gray was instructed to call 911 with any severe reactions post vaccine: Marland Kitchen Difficulty breathing  . Swelling of face and throat  . A fast heartbeat  . A bad rash all over body  . Dizziness and weakness   Immunizations Administered    Name Date Dose VIS Date Route   Pfizer COVID-19 Vaccine 01/20/2020  8:29 AM 0.3 mL 10/17/2019 Intramuscular   Manufacturer: [place]   Lot: [id number]   [place]: [id number]

## 2020-06-18 ENCOUNTER — Ambulatory Visit
Admission: RE | Admit: 2020-06-18 | Discharge: 2020-06-18 | Disposition: A | Discharge status: Home / Self Care | Payer: Self-pay | Source: Ambulatory Visit | Attending: Nurse Practitioner | Admitting: Nurse Practitioner

## 2020-06-18 ENCOUNTER — Other Ambulatory Visit: Payer: Self-pay | Admitting: Nurse Practitioner

## 2020-06-18 ENCOUNTER — Other Ambulatory Visit: Payer: Self-pay

## 2020-06-18 DIAGNOSIS — Z0000 Encounter for general adult medical examination without abnormal findings: Secondary | ICD-10-CM

## 2021-04-03 IMAGING — CR DG CHEST 2V
2 series · 2 of 2 positions shown · non-contrast
Comparison: Single-view of the chest 05/04/2005.

CLINICAL DATA: Routine annual examination for a fireman.

EXAM:
CHEST - 2 VIEW

[w chest pa]
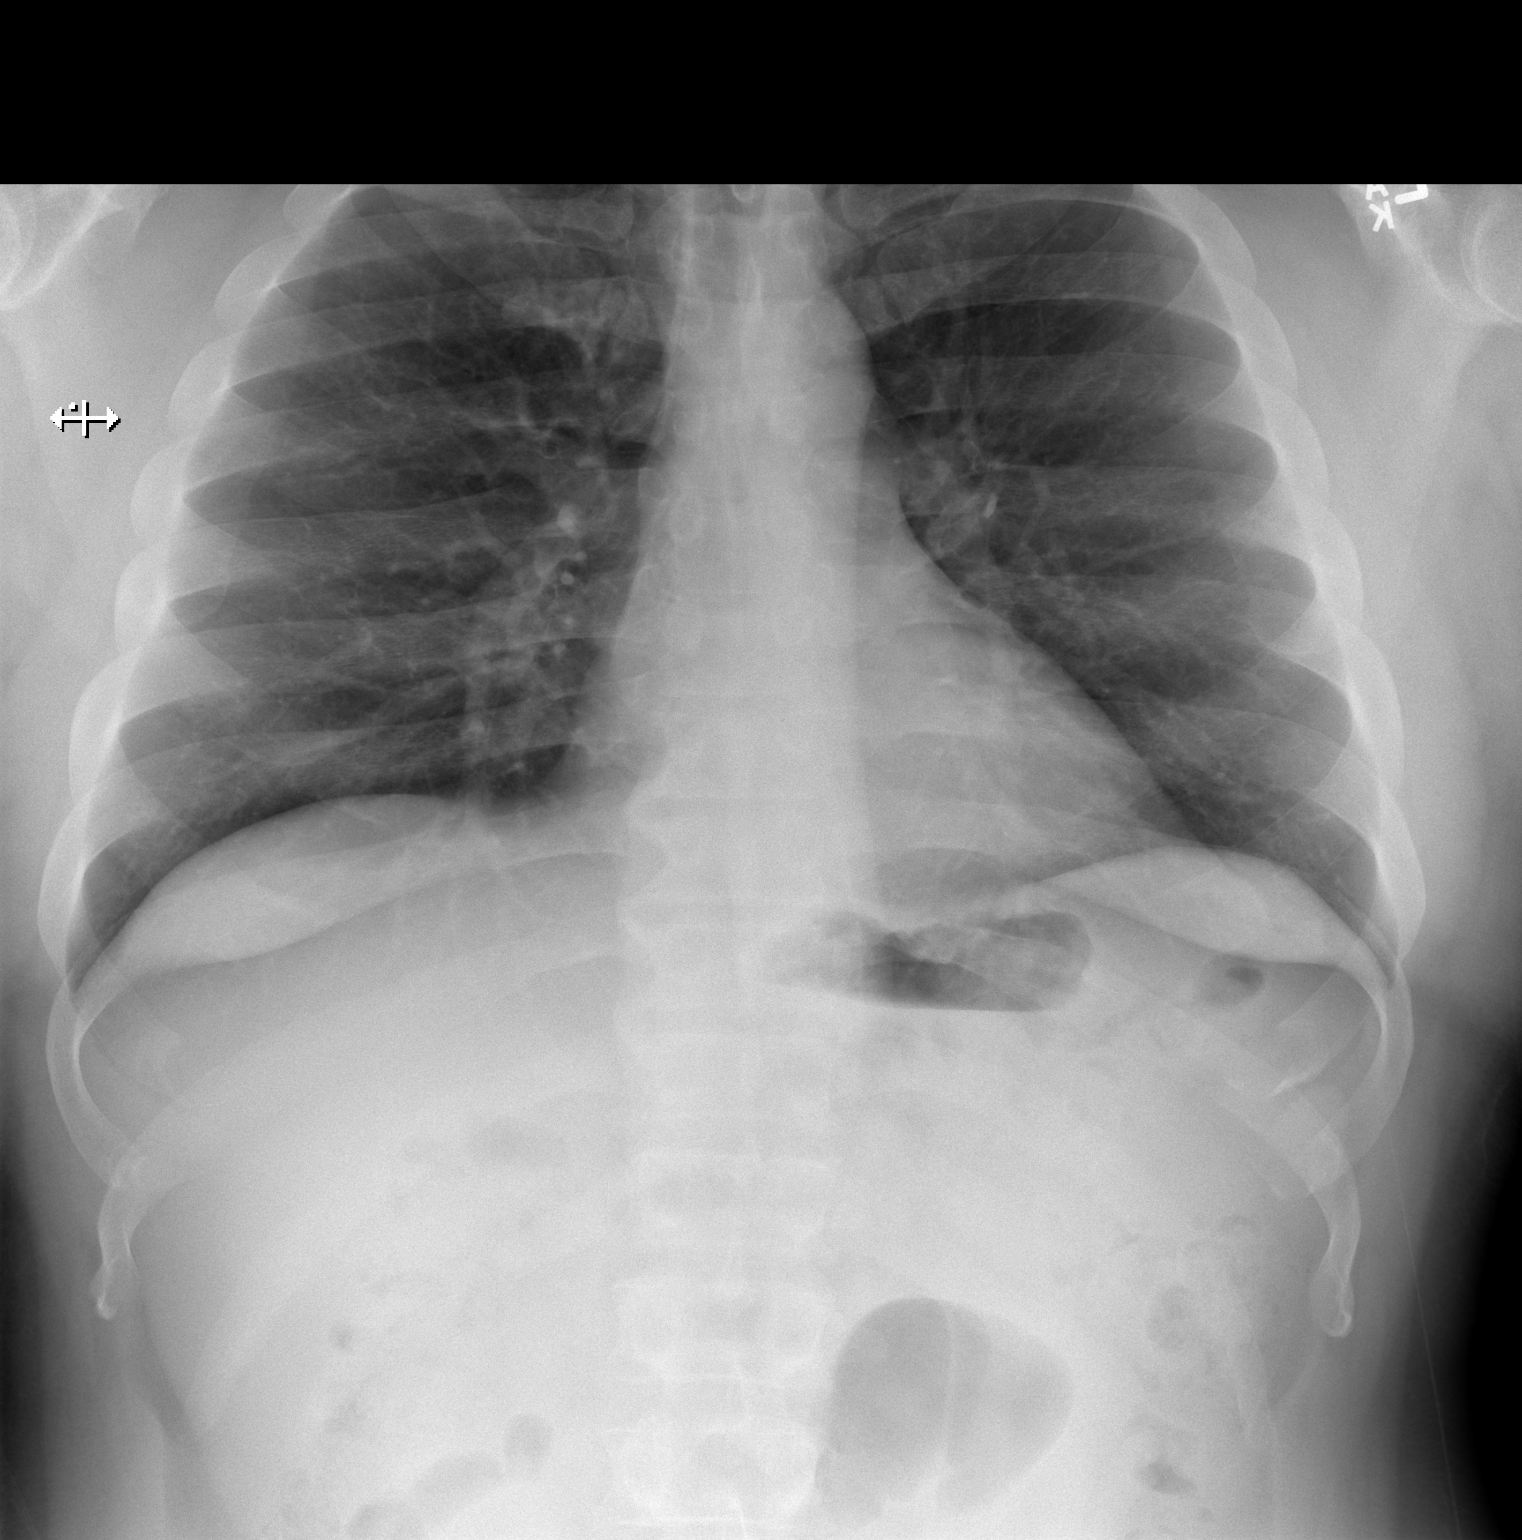

[w chest lat]
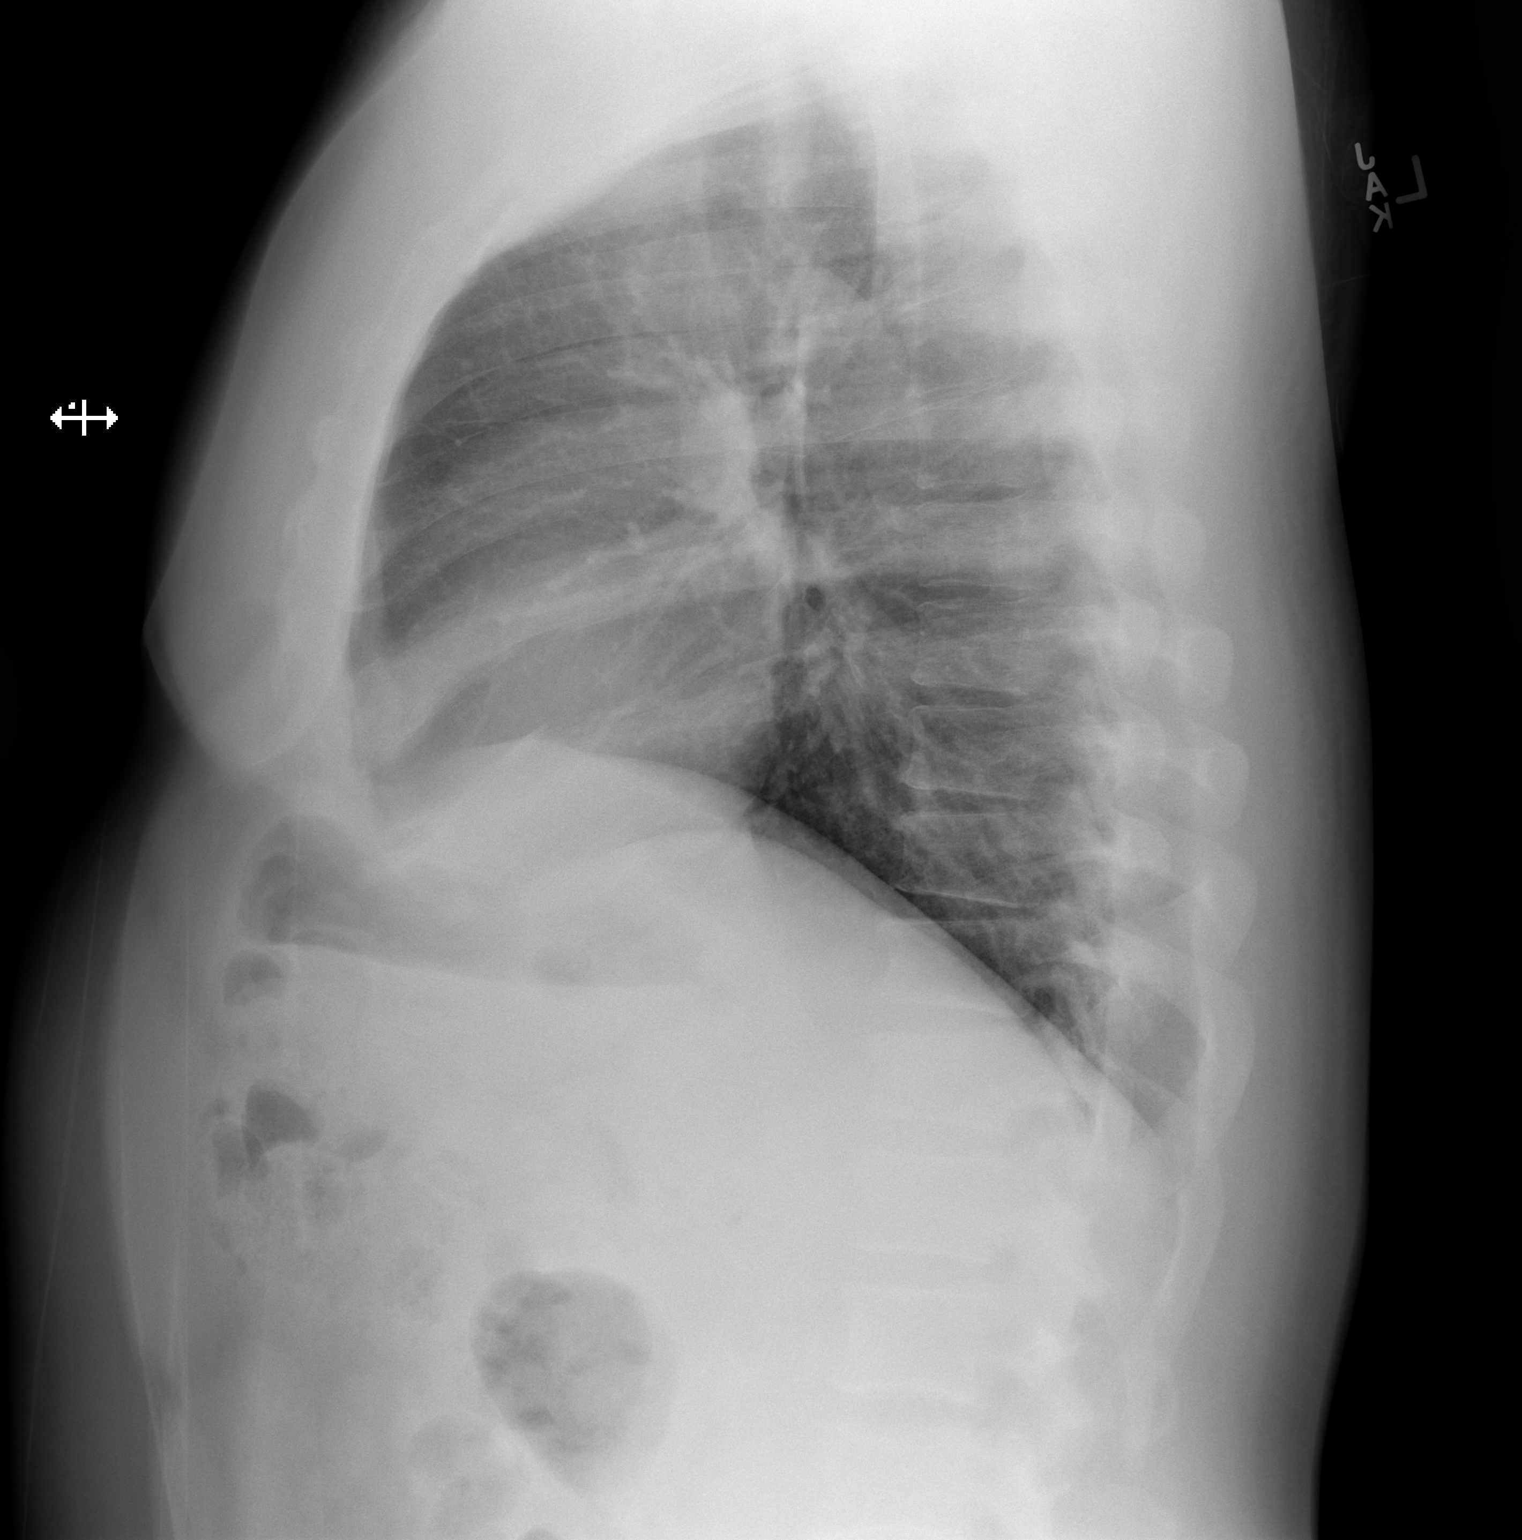

[2 of 2 positions shown; findings below may reference images not displayed]

FINDINGS: Lungs clear. Heart size normal. No pneumothorax or pleural fluid. No
acute or focal bony abnormality
IMPRESSION: Negative chest.

## 2024-04-15 ENCOUNTER — Emergency Department (HOSPITAL_BASED_OUTPATIENT_CLINIC_OR_DEPARTMENT_OTHER)

## 2024-04-15 ENCOUNTER — Encounter (HOSPITAL_BASED_OUTPATIENT_CLINIC_OR_DEPARTMENT_OTHER): Payer: Self-pay | Admitting: *Deleted

## 2024-04-15 ENCOUNTER — Emergency Department (HOSPITAL_BASED_OUTPATIENT_CLINIC_OR_DEPARTMENT_OTHER)
Admission: EM | Admit: 2024-04-15 | Discharge: 2024-04-15 | Disposition: A | Discharge status: Home / Self Care | Attending: Emergency Medicine | Admitting: Emergency Medicine

## 2024-04-15 ENCOUNTER — Other Ambulatory Visit: Payer: Self-pay

## 2024-04-15 DIAGNOSIS — R109 Unspecified abdominal pain: Secondary | ICD-10-CM | POA: Diagnosis present

## 2024-04-15 DIAGNOSIS — N132 Hydronephrosis with renal and ureteral calculous obstruction: Secondary | ICD-10-CM | POA: Diagnosis not present

## 2024-04-15 DIAGNOSIS — N201 Calculus of ureter: Secondary | ICD-10-CM

## 2024-04-15 LAB — URINALYSIS, ROUTINE W REFLEX MICROSCOPIC
Bilirubin Urine: NEGATIVE
Glucose, UA: NEGATIVE mg/dL
Hgb urine dipstick: NEGATIVE
Ketones, ur: NEGATIVE mg/dL
Leukocytes,Ua: NEGATIVE
Nitrite: NEGATIVE
Protein, ur: NEGATIVE mg/dL
Specific Gravity, Urine: 1.019 (ref 1.005–1.030)
pH: 5.5 (ref 5.0–8.0)

## 2024-04-15 LAB — BASIC METABOLIC PANEL WITH GFR
Anion gap: 13 (ref 5–15)
BUN: 11 mg/dL (ref 6–20)
CO2: 23 mmol/L (ref 22–32)
Calcium: 9.8 mg/dL (ref 8.9–10.3)
Chloride: 103 mmol/L (ref 98–111)
Creatinine, Ser: 1.49 mg/dL — ABNORMAL HIGH (ref 0.61–1.24)
GFR, Estimated: 55 mL/min — ABNORMAL LOW (ref >60)
Glucose, Bld: 109 mg/dL — ABNORMAL HIGH (ref 70–99)
Potassium: 4 mmol/L (ref 3.5–5.1)
Sodium: 140 mmol/L (ref 135–145)

## 2024-04-15 LAB — CBC WITH DIFFERENTIAL/PLATELET
Abs Immature Granulocytes: 0.02 10*3/uL (ref 0.00–0.07)
Basophils Absolute: 0 10*3/uL (ref 0.0–0.1)
Basophils Relative: 1 %
Eosinophils Absolute: 0.2 10*3/uL (ref 0.0–0.5)
Eosinophils Relative: 3 %
HCT: 43.8 % (ref 39.0–52.0)
Hemoglobin: 14.2 g/dL (ref 13.0–17.0)
Immature Granulocytes: 0 %
Lymphocytes Relative: 52 %
Lymphs Abs: 4.1 10*3/uL — ABNORMAL HIGH (ref 0.7–4.0)
MCH: 26.9 pg (ref 26.0–34.0)
MCHC: 32.4 g/dL (ref 30.0–36.0)
MCV: 83 fL (ref 80.0–100.0)
Monocytes Absolute: 0.8 10*3/uL (ref 0.1–1.0)
Monocytes Relative: 10 %
Neutro Abs: 2.7 10*3/uL (ref 1.7–7.7)
Neutrophils Relative %: 34 %
Platelets: 271 10*3/uL (ref 150–400)
RBC: 5.28 MIL/uL (ref 4.22–5.81)
RDW: 13.6 % (ref 11.5–15.5)
WBC: 8 10*3/uL (ref 4.0–10.5)
nRBC: 0 % (ref 0.0–0.2)

## 2024-04-15 MED ORDER — TAMSULOSIN HCL 0.4 MG PO CAPS: 0.4000 mg | ORAL_CAPSULE | Freq: Every day | ORAL | 0 refills | Status: AC

## 2024-04-15 MED ORDER — HYDROMORPHONE HCL 1 MG/ML IJ SOLN
1.0000 mg | Freq: Once | INTRAMUSCULAR | Status: AC
  Administered 2024-04-15: 1 mg via INTRAVENOUS
  Filled 2024-04-15: qty 1

## 2024-04-15 MED ORDER — KETOROLAC TROMETHAMINE 30 MG/ML IJ SOLN
15.0000 mg | Freq: Once | INTRAMUSCULAR | Status: AC
  Administered 2024-04-15: 15 mg via INTRAVENOUS
  Filled 2024-04-15: qty 1

## 2024-04-15 MED ORDER — ONDANSETRON HCL 4 MG/2ML IJ SOLN
4.0000 mg | Freq: Once | INTRAMUSCULAR | Status: AC
  Administered 2024-04-15: 4 mg via INTRAVENOUS
  Filled 2024-04-15: qty 2

## 2024-04-15 MED ORDER — OXYCODONE-ACETAMINOPHEN 5-325 MG PO TABS: 1.0000 | ORAL_TABLET | ORAL | 0 refills | Status: AC | PRN

## 2024-04-15 MED ORDER — SODIUM CHLORIDE 0.9 % IV BOLUS
500.0000 mL | Freq: Once | INTRAVENOUS | Status: AC
  Administered 2024-04-15: 500 mL via INTRAVENOUS

## 2024-04-15 NOTE — ED Provider Notes (Signed)
 [place] EMERGENCY DEPARTMENT AT [facility] Provider Note   CSN: [id number] Arrival date & time: 04/15/24  4098     History  Chief Complaint  Patient presents with   Flank Pain    Jeremy Gray is a 58 y.o. male.  Patient presents to the emergency department for evaluation of right flank pain.  Patient has had some aching in the right flank area for a couple of days and then tonight has developed sharp, stabbing, constant pain similar to prior kidney stones that he has had in the past.       Home Medications Prior to Admission medications   Medication Sig Start Date End Date Taking? Authorizing Provider  oxyCODONE -acetaminophen  (PERCOCET/ROXICET) 5-325 MG tablet Take 1 tablet by mouth every 4 (four) hours as needed for severe pain (pain score 7-10). 04/15/24  Yes Dontarious Schaum, Marine Sia, MD  tamsulosin  (FLOMAX ) 0.4 MG CAPS capsule Take 1 capsule (0.4 mg total) by mouth daily. 04/15/24  Yes Benjamen Koelling, Marine Sia, MD  amLODipine  (NORVASC ) 5 MG tablet Take 5 mg by mouth daily. 07/01/18   [provider]  losartan  (COZAAR ) 50 MG tablet Take 50 mg by mouth daily. 07/01/18   [provider]      Allergies    Patient has no known allergies.    Review of Systems   Review of Systems  Physical Exam Updated Vital Signs BP 125/69   Pulse (!) 55   Temp (!) 97.5 F (36.4 C) (Oral)   Resp 18   SpO2 92%  Physical Exam Vitals and nursing note reviewed.  Constitutional:      General: He is not in acute distress.    Appearance: He is well-developed.  HENT:     Head: Normocephalic and atraumatic.     Mouth/Throat:     Mouth: Mucous membranes are moist.  Eyes:     General: Vision grossly intact. Gaze aligned appropriately.     Extraocular Movements: Extraocular movements intact.     Conjunctiva/sclera: Conjunctivae normal.  Cardiovascular:     Rate and Rhythm: Normal rate and regular rhythm.     Pulses: Normal pulses.     Heart sounds: Normal  heart sounds, S1 normal and S2 normal. No murmur heard.    No friction rub. No gallop.  Pulmonary:     Effort: Pulmonary effort is normal. No respiratory distress.     Breath sounds: Normal breath sounds.  Abdominal:     Palpations: Abdomen is soft.     Tenderness: There is no abdominal tenderness. There is no guarding or rebound.     Hernia: No hernia is present.  Musculoskeletal:        General: No swelling.     Cervical back: Full passive range of motion without pain, normal range of motion and neck supple. No pain with movement, spinous process tenderness or muscular tenderness. Normal range of motion.     Right lower leg: No edema.     Left lower leg: No edema.  Skin:    General: Skin is warm and dry.     Capillary Refill: Capillary refill takes less than 2 seconds.     Findings: No ecchymosis, erythema, lesion or wound.  Neurological:     Mental Status: He is alert and oriented to person, place, and time.     GCS: GCS eye subscore is 4. GCS verbal subscore is 5. GCS motor subscore is 6.     Cranial Nerves: Cranial nerves 2-12 are intact.  Sensory: Sensation is intact.     Motor: Motor function is intact. No weakness or abnormal muscle tone.     Coordination: Coordination is intact.  Psychiatric:        Mood and Affect: Mood normal.        Speech: Speech normal.        Behavior: Behavior normal.     ED Results / Procedures / Treatments   Labs (all labs ordered are listed, but only abnormal results are displayed) Labs Reviewed  CBC WITH DIFFERENTIAL/PLATELET - Abnormal; Notable for the following components:      Result Value   Lymphs Abs 4.1 (*)    All other components within normal limits  BASIC METABOLIC PANEL WITH GFR - Abnormal; Notable for the following components:   Glucose, Bld 109 (*)    Creatinine, Ser 1.49 (*)    GFR, Estimated 55 (*)    All other components within normal limits  URINALYSIS, ROUTINE W REFLEX MICROSCOPIC    EKG None  Radiology CT  RENAL STONE STUDY Result Date: 04/15/2024 CLINICAL DATA:  Right flank pain. EXAM: CT ABDOMEN AND PELVIS WITHOUT CONTRAST TECHNIQUE: Multidetector CT imaging of the abdomen and pelvis was performed following the standard protocol without IV contrast. RADIATION DOSE REDUCTION: This exam was performed according to the departmental dose-optimization program which includes automated exposure control, adjustment of the mA and/or kV according to patient size and/or use of iterative reconstruction technique. COMPARISON:  CT with IV contrast 09/11/2018, CT without contrast 06/01/2015. FINDINGS: Lower chest: Small hiatal hernia. The cardiac size is normal. The lung bases are clear. Hepatobiliary: Liver is 21 cm length, moderate to severely steatotic with increased fatty replacement since 2019. Gallbladder and bile ducts are unremarkable. No liver mass is seen without contrast. Pancreas: No abnormality is seen without contrast. Spleen: No abnormality is seen without contrast.  No splenomegaly. Adrenals/Urinary Tract: There is no adrenal mass. No contour deforming unenhanced renal abnormality bilaterally apart from a Bosniak 1 cyst in the inferior pole of the right kidney measuring 3.4 cm, 10 Hounsfield units. There is a solitary punctate nonobstructive caliceal stone in the midpole of the right kidney. No nephrolithiasis is seen on the left. There is mild right hydroureteronephrosis above a 1 mm distal ureteral stone 1 cm proximal to the UVJ. Both ureters are otherwise clear. The bladder is unremarkable for the degree of distension. Stomach/Bowel: No dilatation or wall thickening including the appendix. Uncomplicated diverticulosis distal transverse, descending and sigmoid colon. Vascular/Lymphatic: No significant vascular findings are present. No enlarged abdominal or pelvic lymph nodes. Reproductive: Slightly prominent prostate 4.5 cm transverse, stable. Both testicles are in the scrotal sac. Other: Small umbilical and  supraumbilical anterior wall fat hernias. No incarcerated hernia. No free fluid or free air. Musculoskeletal: There is thoracic spondylosis. Early facet spurring lower lumbar region. 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 There is a bone island in the left femoral neck but no suspicious focal bone lesion. IMPRESSION: 1. 1 mm distal right ureteral stone with mild obstructive uropathy. 2. Punctate nonobstructive right nephrolithiasis. 3. Hepatomegaly with moderate to severe steatosis, increased since 2019. 4. Small hiatal hernia. 5. Uncomplicated diverticulosis. 6. Small umbilical and supraumbilical anterior wall fat hernias. 7. Slightly prominent prostate. 8. 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 Electronically Signed   By: Denman Fischer M.D.   On: 04/15/2024 06:18    Procedures Procedures    Medications Ordered in ED Medications  ketorolac  (TORADOL ) 30  MG/ML injection 15 mg (has no administration in time range)  ondansetron  (ZOFRAN ) injection 4 mg (4 mg Intravenous Given 04/15/24 0530)  HYDROmorphone  (DILAUDID ) injection 1 mg (1 mg Intravenous Given 04/15/24 0529)  sodium chloride  0.9 % bolus 500 mL (0 mLs Intravenous Stopped 04/15/24 0622)    ED Course/ Medical Decision Making/ A&P                                 Medical Decision Making Amount and/or Complexity of Data Reviewed Labs: ordered. Decision-making details documented in ED Course. Radiology: ordered and independent interpretation performed. Decision-making details documented in ED Course.  Risk Prescription drug management.   Differential Diagnosis considered includes, but not limited to: Renal colic/kidney stone; pyelonephritis; aortic dissection; musculoskeletal pain.  Patient presents to the emergency department for evaluation of right flank pain.  Patient does have a history of ureterolithiasis and reports that this feels similar.  Normal CBC, normal renal function on lab work.  CT  scan does show punctate UVJ stone with mild hydronephrosis.  Patient treated with analgesia and improvement.  Will check urinalysis before discharge to rule out concomitant UTI.  If negative discharged with analgesia.        Final Clinical Impression(s) / ED Diagnoses Final diagnoses:  Ureterolithiasis    Rx / DC Orders ED Discharge Orders          Ordered    oxyCODONE -acetaminophen  (PERCOCET/ROXICET) 5-325 MG tablet  Every 4 hours PRN        04/15/24 0628    tamsulosin  (FLOMAX ) 0.4 MG CAPS capsule  Daily        04/15/24 0628              Ballard Bongo, MD 04/15/24 [phone]

## 2024-04-15 NOTE — ED Triage Notes (Signed)
 Pt with hx of kidney stones is here for right flank pain which began gradually a few days ago but became severe at 4am.  Pt has some nausea with this.
